# Patient Record
Sex: Male | Born: 1990
Health system: Southern US, Community
[De-identification: ages and names within clinical notes are randomized; demographics above are authoritative.]

## PROBLEM LIST (undated history)

## (undated) DIAGNOSIS — E785 Hyperlipidemia, unspecified: Secondary | ICD-10-CM

## (undated) DIAGNOSIS — J302 Other seasonal allergic rhinitis: Secondary | ICD-10-CM

## (undated) DIAGNOSIS — T7840XA Allergy, unspecified, initial encounter: Secondary | ICD-10-CM

## (undated) HISTORY — DX: Hyperlipidemia, unspecified: E78.5

## (undated) HISTORY — PX: BRAIN SURGERY: SHX531

## (undated) HISTORY — DX: Allergy, unspecified, initial encounter: T78.40XA

## (undated) HISTORY — DX: Other seasonal allergic rhinitis: J30.2

---

## 2011-12-24 ENCOUNTER — Other Ambulatory Visit: Payer: Self-pay | Admitting: Family Medicine

## 2011-12-24 DIAGNOSIS — R1033 Periumbilical pain: Secondary | ICD-10-CM

## 2011-12-25 ENCOUNTER — Ambulatory Visit
Admission: RE | Admit: 2011-12-25 | Discharge: 2011-12-25 | Disposition: A | Discharge status: Home / Self Care | Payer: Self-pay | Source: Ambulatory Visit | Attending: Family Medicine | Admitting: Family Medicine

## 2011-12-25 DIAGNOSIS — R1033 Periumbilical pain: Secondary | ICD-10-CM

## 2013-08-21 IMAGING — US US ABDOMEN COMPLETE
1 series · 14 of 25 positions shown · non-contrast
Comparison: None.

CLINICAL DATA: Intermittent umbilical pain

COMPLETE ABDOMINAL ULTRASOUND

[Series 1: us abdomen complete · 0.23mm/px · 14 of 80 slices shown]
[im 1/80]
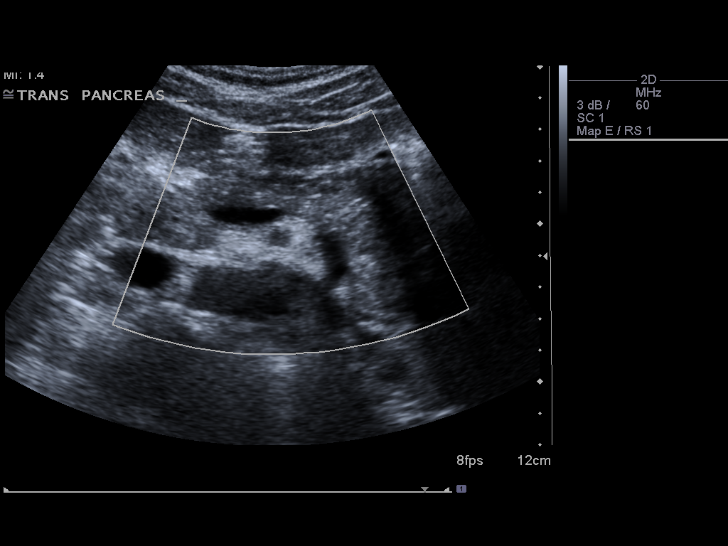
[im 7/80]
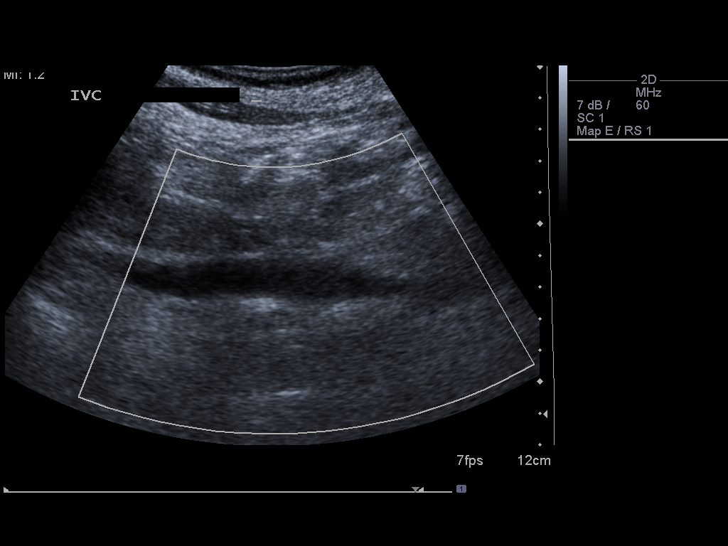
[im 14/80]
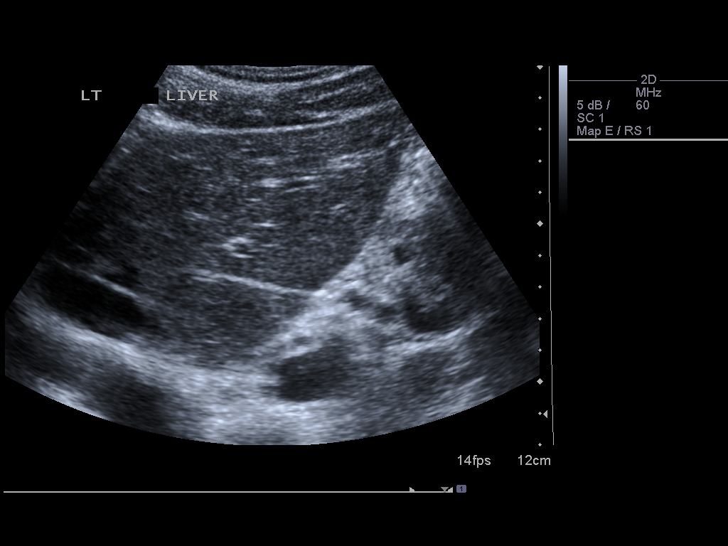
[im 20/80]
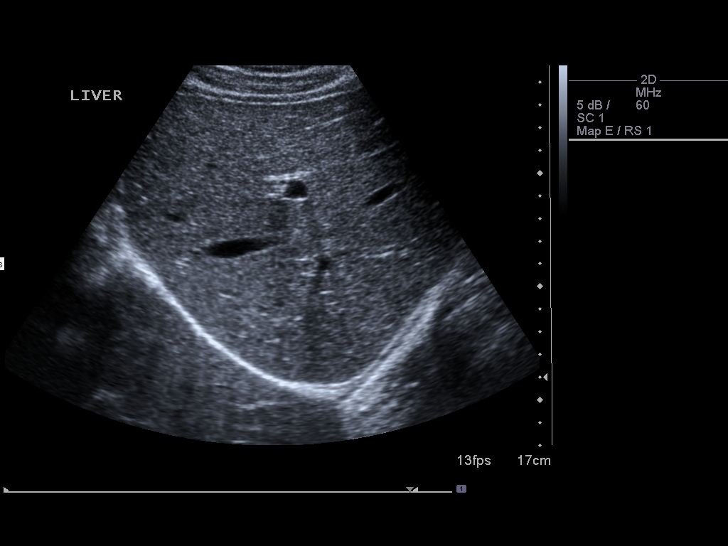
[im 27/80]
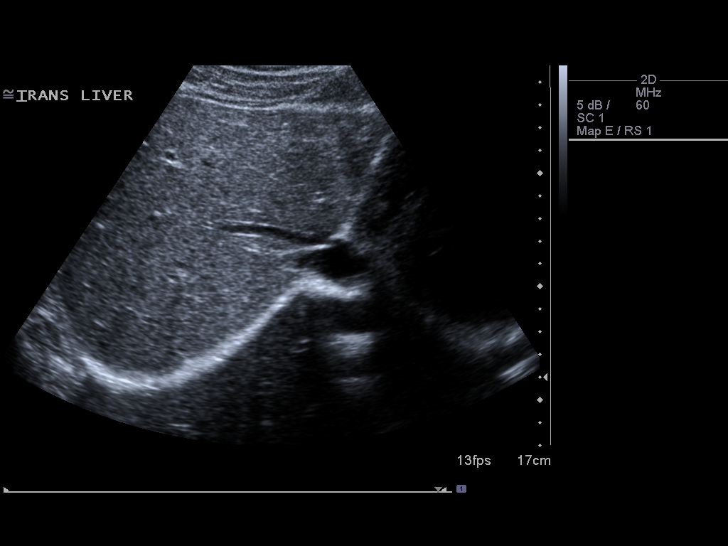
[im 30/80]
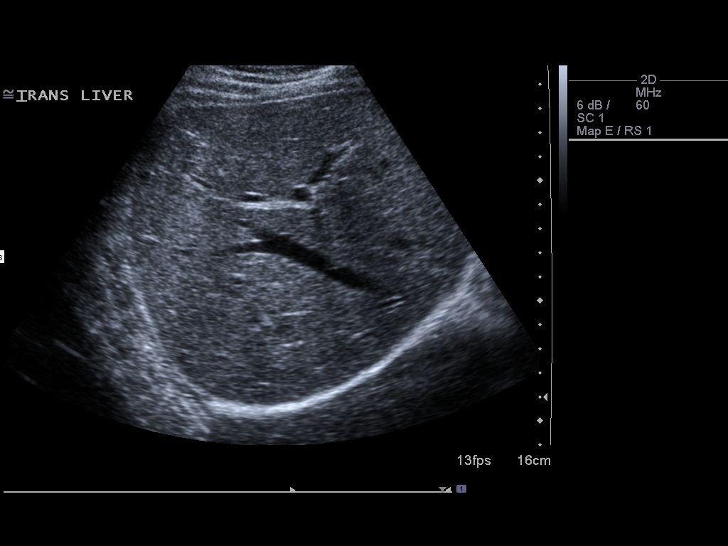
[im 37/80]
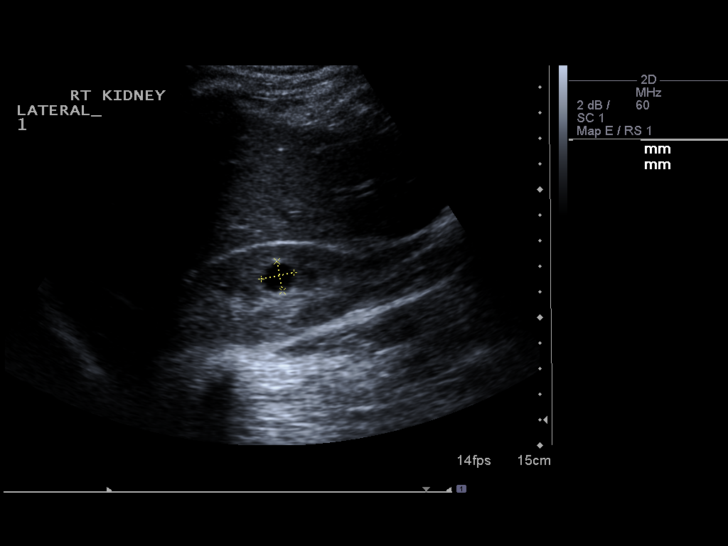
[im 43/80]
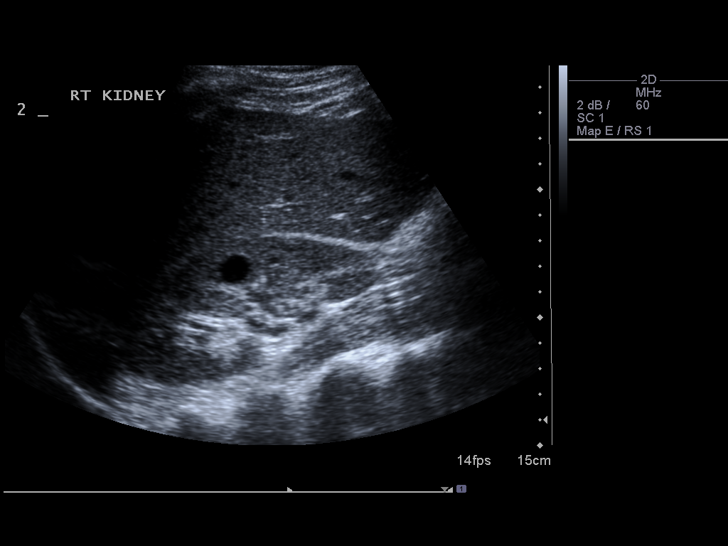
[im 50/80]
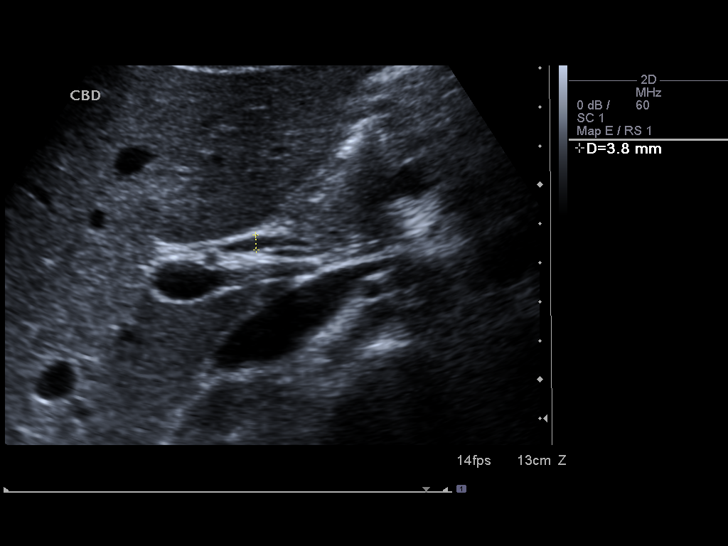
[im 53/80]
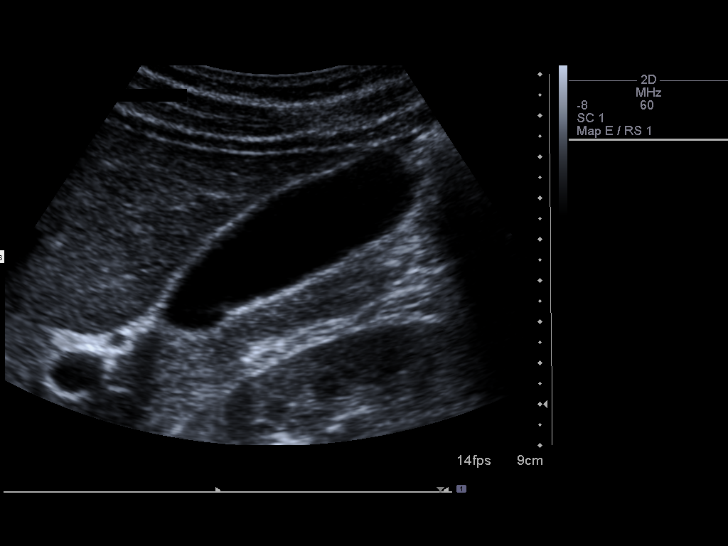
[im 60/80]
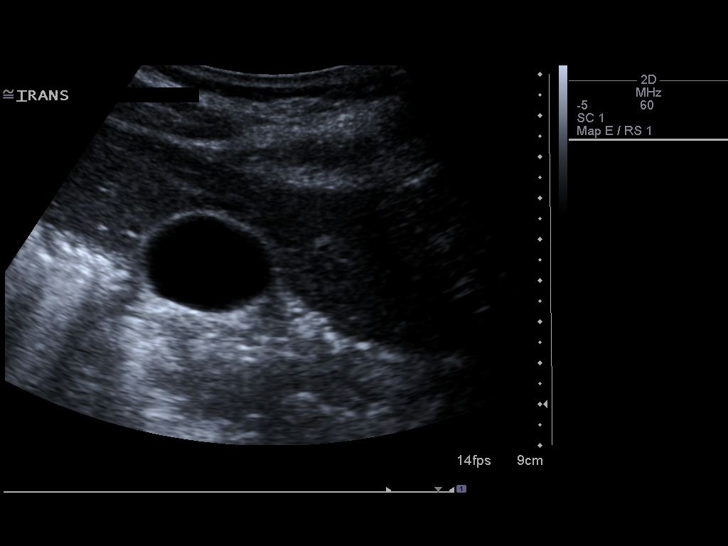
[im 66/80]
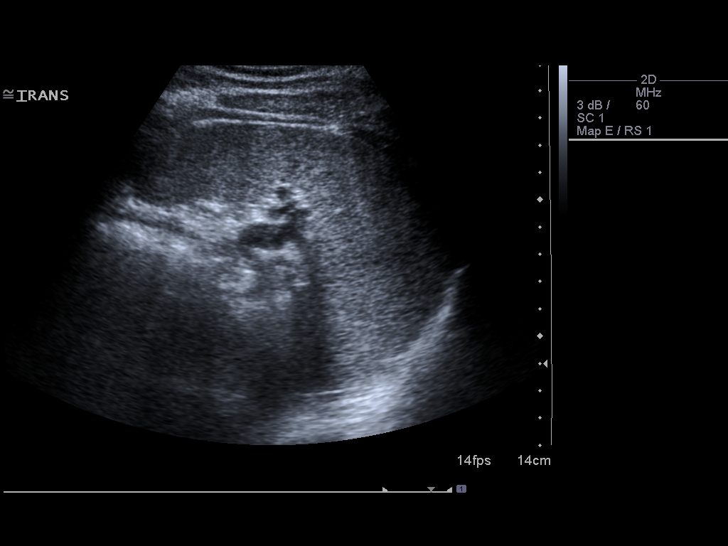
[im 73/80]
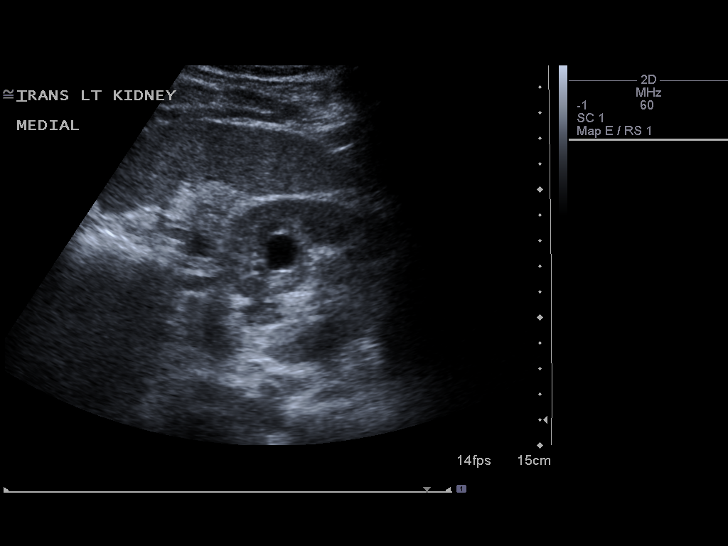
[im 80/80]
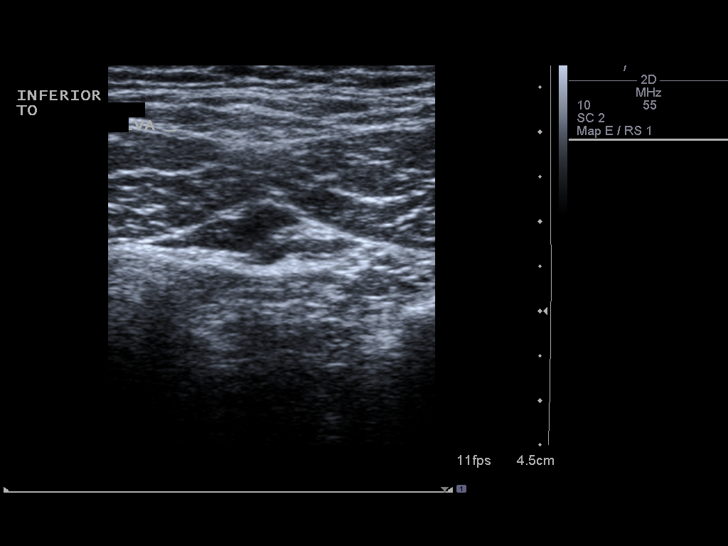

[14 of 25 positions shown; findings below may reference images not displayed]

FINDINGS: Gallbladder:  The gallbladder is visualized and no gallstones are
noted.  There is no pain over the gallbladder with compression.

Common bile duct:  The common bile duct is normal measuring 3.8 mm
in diameter.

Liver:  The liver has a normal echogenic pattern.  No ductal
dilatation is seen.

IVC:  Appears normal.

Pancreas:  No focal abnormality seen.

Spleen:  The pancreas is normal measuring 10.2 cm sagittally.

Right Kidney:  No hydronephrosis is seen.  The right kidney
measures 11.7 cm sagittally.  Several cysts are present measuring
no larger than 1.3 cm in diameter.

Left Kidney:  No hydronephrosis is noted.  The left kidney measures
12.5 cm.  A cyst is noted medially of 1.5 cm in diameter.

Abdominal aorta:  The abdominal aorta is normal in caliber.

Scanning over the umbilicus, no hernia is seen and no mass or fluid
is noted.
IMPRESSION: Negative abdominal ultrasound.  No abnormality is seen around
umbilicus.  No gallstones.  No ductal dilatation.

## 2017-12-04 DIAGNOSIS — H5213 Myopia, bilateral: Secondary | ICD-10-CM | POA: Diagnosis not present

## 2017-12-04 DIAGNOSIS — H52221 Regular astigmatism, right eye: Secondary | ICD-10-CM | POA: Diagnosis not present

## 2018-01-16 ENCOUNTER — Ambulatory Visit (INDEPENDENT_AMBULATORY_CARE_PROVIDER_SITE_OTHER): Payer: 59 | Admitting: Physician Assistant

## 2018-01-16 ENCOUNTER — Other Ambulatory Visit: Payer: Self-pay

## 2018-01-16 ENCOUNTER — Encounter: Payer: Self-pay | Admitting: Physician Assistant

## 2018-01-16 VITALS — BP 132/92 | HR 72 | Temp 98.5°F | Ht 70.0 in | Wt 181.6 lb

## 2018-01-16 DIAGNOSIS — Z1329 Encounter for screening for other suspected endocrine disorder: Secondary | ICD-10-CM | POA: Diagnosis not present

## 2018-01-16 DIAGNOSIS — Z23 Encounter for immunization: Secondary | ICD-10-CM

## 2018-01-16 DIAGNOSIS — E7800 Pure hypercholesterolemia, unspecified: Secondary | ICD-10-CM

## 2018-01-16 DIAGNOSIS — Z130 Encounter for screening for diseases of the blood and blood-forming organs and certain disorders involving the immune mechanism: Secondary | ICD-10-CM

## 2018-01-16 DIAGNOSIS — Z1321 Encounter for screening for nutritional disorder: Secondary | ICD-10-CM | POA: Diagnosis not present

## 2018-01-16 DIAGNOSIS — Z13228 Encounter for screening for other metabolic disorders: Secondary | ICD-10-CM | POA: Diagnosis not present

## 2018-01-16 DIAGNOSIS — Z0000 Encounter for general adult medical examination without abnormal findings: Secondary | ICD-10-CM | POA: Diagnosis not present

## 2018-01-16 NOTE — Patient Instructions (Addendum)
  Keep a log of your BP.  If it runs consistently greater than 140 over 90 then please write these down and come back and see me. Good luck in PA school.    IF you received an x-ray today, you will receive an invoice from [facility]. Please contact [facility] at [phone] with questions or concerns regarding your invoice.   IF you received labwork today, you will receive an invoice from [facility]. Please contact [facility] at [phone] with questions or concerns regarding your invoice.   Our billing staff will not be able to assist you with questions regarding bills from these companies.  You will be contacted with the lab results as soon as they are available. The fastest way to get your results is to activate your My Chart account. Instructions are located on the last page of this paperwork. If you have not heard from [place] regarding the results in 2 weeks, please contact this office.

## 2018-01-16 NOTE — Progress Notes (Signed)
01/16/2018 2:07 PM   DOB: 08-Jan-1991 / MRN: [id number]  SUBJECTIVE:  Jeremy Blanchard is a 27 y.o. male presenting for annual exam.  Does not have a copy of his MMR titers and would like immunization for that today.  Does not drink or smoke.  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 Family Hx negative for early cardiac death but maternal grand father and grand mother both with disease in 19s and 30s.  Would like lipids checked today to further define his risk.    There is no immunization history for the selected administration types on file for this patient.   He has No Known Allergies.   He  has no past medical history on file.    He  reports that he has never smoked. He has never used smokeless tobacco. He reports that he does not use drugs. He  reports that he currently engages in sexual activity. The patient  has no past surgical history on file.  His family history is not on file.  Review of Systems  Constitutional: Negative for chills, diaphoresis and fever.  Eyes: Negative.   Respiratory: Negative for cough, hemoptysis, sputum production, shortness of breath and wheezing.   Cardiovascular: Negative for chest pain, orthopnea and leg swelling.  Gastrointestinal: Negative for abdominal pain, blood in stool, constipation, diarrhea, heartburn, melena, nausea and vomiting.  Genitourinary: Negative for dysuria, flank pain, frequency, hematuria and urgency.  Skin: Negative for rash.  Neurological: Negative for dizziness, sensory change, speech change, focal weakness and headaches.    The problem list and medications were reviewed and updated by myself where necessary and exist elsewhere in the encounter.   OBJECTIVE:  BP (!) 132/92 (BP Location: Left Arm, Patient Position: Sitting, Cuff Size: Normal)   Pulse 72   Temp 98.5 F (36.9 C) (Oral)   Ht '5\' 10"'$  (1.778 m)   Wt 181  lb 9.6 oz (82.4 kg)   SpO2 96%   BMI 26.06 kg/m   Wt Readings from Last 3 Encounters:  01/16/18 181 lb 9.6 oz (82.4 kg)   Temp Readings from Last 3 Encounters:  01/16/18 98.5 F (36.9 C) (Oral)   BP Readings from Last 3 Encounters:  01/16/18 (!) 132/92   Pulse Readings from Last 3 Encounters:  01/16/18 72     Physical Exam  Constitutional: He is oriented to person, place, and time. He appears well-developed. He is active.  Non-toxic appearance. He does not appear ill.  Eyes: Pupils are equal, round, and reactive to light. Conjunctivae and EOM are normal.  Cardiovascular: Normal rate, regular rhythm, S1 normal, S2 normal, normal heart sounds, intact distal pulses and normal pulses. Exam reveals no gallop and no friction rub.  No murmur heard. Pulmonary/Chest: Effort normal. No stridor. No respiratory distress. He has no wheezes. He has no rales.  Abdominal: Soft. Normal appearance and bowel sounds are normal. He exhibits no distension and no mass. There is no tenderness. There is no rigidity, no rebound, no guarding and no CVA tenderness. No hernia.  Musculoskeletal: Normal range of motion. He exhibits no edema.  Neurological: He is alert and oriented to person, place, and time. He has normal strength and normal reflexes. He is not disoriented. No cranial nerve deficit or sensory deficit. He exhibits normal muscle tone. Coordination and gait normal.  Skin: Skin is warm  and dry. He is not diaphoretic. No pallor.  Psychiatric: He has a normal mood and affect. His behavior is normal.  Nursing note and vitals reviewed.   No results found for this or any previous visit (from the past 72 hour(s)).  No results found.  ASSESSMENT AND PLAN:  Jeremy Blanchard was seen today for establish care.  Diagnoses and all orders for this visit:  Annual physical exam  Screening for endocrine, nutritional, metabolic and immunity disorder -     CBC -     Hemoglobin A1c -     Basic metabolic panel -      Hepatic function panel -     GC/Chlamydia Probe Amp -     Urinalysis, dipstick only -     HIV antibody (with reflex) -     Lipid Panel -     Care order/instruction:  Need for MMR vaccine -     MMR vaccine subcutaneous    The patient is advised to call or return to clinic if he does not see an improvement in symptoms, or to seek the care of the closest emergency department if he worsens with the above plan.   Philis Fendt, MHS, PA-C [facility] 01/16/2018 2:07 PM

## 2018-01-17 LAB — URINALYSIS, DIPSTICK ONLY
BILIRUBIN UA: NEGATIVE
Glucose, UA: NEGATIVE
KETONES UA: NEGATIVE
Leukocytes, UA: NEGATIVE
NITRITE UA: NEGATIVE
PH UA: 6 (ref 5.0–7.5)
RBC UA: NEGATIVE
SPEC GRAV UA: 1.024 (ref 1.005–1.030)
Urobilinogen, Ur: 0.2 mg/dL (ref 0.2–1.0)

## 2018-01-17 LAB — HEMOGLOBIN A1C
Est. average glucose Bld gHb Est-mCnc: 108 mg/dL
Hgb A1c MFr Bld: 5.4 % (ref 4.8–5.6)

## 2018-01-17 LAB — HEPATIC FUNCTION PANEL
ALT: 27 IU/L (ref 0–44)
AST: 20 IU/L (ref 0–40)
Albumin: 4.9 g/dL (ref 3.5–5.5)
Alkaline Phosphatase: 49 IU/L (ref 39–117)
Bilirubin Total: 0.4 mg/dL (ref 0.0–1.2)
Bilirubin, Direct: 0.13 mg/dL (ref 0.00–0.40)
Total Protein: 7.4 g/dL (ref 6.0–8.5)

## 2018-01-17 LAB — BASIC METABOLIC PANEL
BUN/Creatinine Ratio: 15 (ref 9–20)
BUN: 14 mg/dL (ref 6–20)
CALCIUM: 9.6 mg/dL (ref 8.7–10.2)
CO2: 25 mmol/L (ref 20–29)
CREATININE: 0.95 mg/dL (ref 0.76–1.27)
Chloride: 99 mmol/L (ref 96–106)
GFR, EST AFRICAN AMERICAN: 127 mL/min/{1.73_m2} (ref >59)
GFR, EST NON AFRICAN AMERICAN: 110 mL/min/{1.73_m2} (ref >59)
Glucose: 103 mg/dL — ABNORMAL HIGH (ref 65–99)
POTASSIUM: 4.6 mmol/L (ref 3.5–5.2)
Sodium: 139 mmol/L (ref 134–144)

## 2018-01-17 LAB — LIPID PANEL
CHOL/HDL RATIO: 4.6 ratio (ref 0.0–5.0)
Cholesterol, Total: 183 mg/dL (ref 100–199)
HDL: 40 mg/dL (ref >39)
LDL CALC: 127 mg/dL — AB (ref 0–99)
TRIGLYCERIDES: 81 mg/dL (ref 0–149)
VLDL Cholesterol Cal: 16 mg/dL (ref 5–40)

## 2018-01-17 LAB — CBC
Hematocrit: 47.3 % (ref 37.5–51.0)
Hemoglobin: 16.3 g/dL (ref 13.0–17.7)
MCH: 29.2 pg (ref 26.6–33.0)
MCHC: 34.5 g/dL (ref 31.5–35.7)
MCV: 85 fL (ref 79–97)
Platelets: 233 x10E3/uL (ref 150–379)
RBC: 5.58 x10E6/uL (ref 4.14–5.80)
RDW: 13.4 % (ref 12.3–15.4)
WBC: 5 x10E3/uL (ref 3.4–10.8)

## 2018-01-17 LAB — HIV ANTIBODY (ROUTINE TESTING W REFLEX): HIV Screen 4th Generation wRfx: NONREACTIVE

## 2018-01-18 LAB — GC/CHLAMYDIA PROBE AMP
CHLAMYDIA, DNA PROBE: NEGATIVE
NEISSERIA GONORRHOEAE BY PCR: NEGATIVE

## 2018-01-19 DIAGNOSIS — E7800 Pure hypercholesterolemia, unspecified: Secondary | ICD-10-CM | POA: Diagnosis not present

## 2018-01-19 NOTE — Addendum Note (Signed)
Addended by: Ileana Roup on: 01/19/2018 10:57 AM   Modules accepted: Orders

## 2018-01-20 LAB — HIGH SENSITIVITY CRP: CRP HIGH SENSITIVITY: 2.36 mg/L (ref 0.00–3.00)

## 2018-01-20 LAB — APOLIPOPROTEIN B: APOLIPOPROTEIN B: 107 mg/dL — AB (ref <90)

## 2018-01-21 NOTE — Progress Notes (Signed)
I discussed the results with the patient and advised high-fiber diet.  He will come back in about 3 months for a recheck of this.  A morning appointment would be best if he needs to be fasting.  Please facilitate a 67-month appointment for the patient.

## 2018-05-11 ENCOUNTER — Encounter: Payer: Self-pay | Admitting: Family Medicine

## 2018-05-11 ENCOUNTER — Ambulatory Visit: Payer: Self-pay | Admitting: Family Medicine

## 2018-05-11 VITALS — BP 120/85 | HR 85 | Temp 98.9°F | Resp 18 | Wt 184.2 lb

## 2018-05-11 DIAGNOSIS — J069 Acute upper respiratory infection, unspecified: Secondary | ICD-10-CM

## 2018-05-11 MED ORDER — DOXYCYCLINE HYCLATE 100 MG PO TABS: 100.0000 mg | ORAL_TABLET | Freq: Two times a day (BID) | ORAL | 0 refills | Status: AC

## 2018-05-11 NOTE — Patient Instructions (Signed)
Upper Respiratory Infection, Adult Most upper respiratory infections (URIs) are caused by a virus. A URI affects the nose, throat, and upper air passages. The most common type of URI is often called "the common cold." Follow these instructions at home:  Take medicines only as told by your doctor.  Gargle warm saltwater or take cough drops to comfort your throat as told by your doctor.  Use a warm mist humidifier or inhale steam from a shower to increase air moisture. This may make it easier to breathe.  Drink enough fluid to keep your pee (urine) clear or pale yellow.  Eat soups and other clear broths.  Have a healthy diet.  Rest as needed.  Go back to work when your fever is gone or your doctor says it is okay. ? You may need to stay home longer to avoid giving your URI to others. ? You can also wear a face mask and wash your hands often to prevent spread of the virus.  Use your inhaler more if you have asthma.  Do not use any tobacco products, including cigarettes, chewing tobacco, or electronic cigarettes. If you need help quitting, ask your doctor. Contact a doctor if:  You are getting worse, not better.  Your symptoms are not helped by medicine.  You have chills.  You are getting more short of breath.  You have brown or red mucus.  You have yellow or brown discharge from your nose.  You have pain in your face, especially when you bend forward.  You have a fever.  You have puffy (swollen) neck glands.  You have pain while swallowing.  You have white areas in the back of your throat. Get help right away if:  You have very bad or constant: ? Headache. ? Ear pain. ? Pain in your forehead, behind your eyes, and over your cheekbones (sinus pain). ? Chest pain.  You have long-lasting (chronic) lung disease and any of the following: ? Wheezing. ? Long-lasting cough. ? Coughing up blood. ? A change in your usual mucus.  You have a stiff neck.  You have  changes in your: ? Vision. ? Hearing. ? Thinking. ? Mood. This information is not intended to replace advice given to you by your health care provider. Make sure you discuss any questions you have with your health care provider. Document Released: 02/19/2008 Document Revised: 05/05/2016 Document Reviewed: 12/08/2013 Elsevier Interactive Patient Education  2018 Elsevier Inc.  

## 2018-05-11 NOTE — Progress Notes (Signed)
Jeremy Blanchard is a 27 y.o. male who presents today with concerns of cough x 4 days. 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 He reports fevers for the past 2 days. TMAX 102 but responds to antipyretic. Patient reports fatigue and productive cough that he has been using Mucinex DM for with minimal relief.   Review of Systems  Constitutional: Negative for chills, fever and malaise/fatigue.  HENT: Positive for congestion. Negative for ear discharge, ear pain, sinus pain and sore throat.   Eyes: Negative.   Respiratory: Positive for cough and sputum production. Negative for shortness of breath.   Cardiovascular: Negative.  Negative for chest pain.  Gastrointestinal: Negative for abdominal pain, diarrhea, nausea and vomiting.  Genitourinary: Negative for dysuria, frequency, hematuria and urgency.  Musculoskeletal: Negative for myalgias.  Skin: Negative.   Neurological: Negative for headaches.  Endo/Heme/Allergies: Negative.   Psychiatric/Behavioral: Negative.     O: Vitals:   05/11/18 1418  BP: 120/85  Pulse: 85  Resp: 18  Temp: 98.9 F (37.2 C)  SpO2: 94%     Physical Exam  Constitutional: He is oriented to person, place, and time. Vital signs are normal. He appears well-developed and well-nourished. He is active.  Non-toxic appearance. He does not have a sickly appearance.  HENT:  Head: Normocephalic.  Right Ear: Hearing, tympanic membrane, external ear and ear canal normal.  Left Ear: Hearing, tympanic membrane, external ear and ear canal normal.  Nose: Nose normal.  Mouth/Throat: Uvula is midline and oropharynx is clear and moist.  Neck: Normal range of motion. Neck supple.  Cardiovascular: Normal rate, regular rhythm, normal heart sounds and normal pulses.  Pulmonary/Chest: Effort normal. He has no decreased breath sounds. He has no wheezes. He has rhonchi in the right middle field, the right lower field, the left middle field and the  left lower field. He has no rales.  Abdominal: Soft. Bowel sounds are normal.  Musculoskeletal: Normal range of motion.  Lymphadenopathy:       Head (right side): No submental and no submandibular adenopathy present.       Head (left side): No submental and no submandibular adenopathy present.    He has no cervical adenopathy.  Neurological: He is alert and oriented to person, place, and time.  Psychiatric: He has a normal mood and affect.  Vitals reviewed.  A: 1. Upper respiratory tract infection, unspecified type    P: Discussed exam findings, diagnosis etiology and medication use and indications reviewed with patient. Follow- Up and discharge instructions provided. No emergent/urgent issues found on exam.  Patient verbalized understanding of information provided and agrees with plan of care (POC), all questions answered.  1. Upper respiratory tract infection, unspecified type - doxycycline (VIBRA-TABS) 100 MG tablet; Take 1 tablet (100 mg total) by mouth 2 (two) times daily for 10 days.  Other orders - dextromethorphan-guaiFENesin (MUCINEX DM) 30-600 MG 12hr tablet; Take 1 tablet by mouth 2 (two) times daily. - acetaminophen (TYLENOL) 160 MG/5ML elixir; Take 15 mg/kg by mouth every 4 (four) hours as needed for fever.

## 2018-05-11 NOTE — Progress Notes (Signed)
Work note, patient can return to work on 05/13/2018

## 2018-05-13 ENCOUNTER — Telehealth: Payer: Self-pay

## 2018-05-13 NOTE — Telephone Encounter (Signed)
Mailbox is full.

## 2018-05-13 NOTE — Telephone Encounter (Signed)
Patient called [place] back and states he is doing better, he is feeling good.

## 2018-05-28 ENCOUNTER — Ambulatory Visit: Payer: Self-pay | Admitting: Nurse Practitioner

## 2018-05-28 VITALS — BP 118/80 | HR 87 | Temp 98.4°F | Wt 181.6 lb

## 2018-05-28 DIAGNOSIS — L259 Unspecified contact dermatitis, unspecified cause: Secondary | ICD-10-CM

## 2018-05-28 DIAGNOSIS — J209 Acute bronchitis, unspecified: Secondary | ICD-10-CM

## 2018-05-28 MED ORDER — AZITHROMYCIN 250 MG PO TABS: ORAL_TABLET | ORAL | 0 refills | Status: DC

## 2018-05-28 MED ORDER — PSEUDOEPH-BROMPHEN-DM 30-2-10 MG/5ML PO SYRP: 5.0000 mL | ORAL_SOLUTION | Freq: Four times a day (QID) | ORAL | 0 refills | Status: AC | PRN

## 2018-05-28 MED ORDER — PREDNISONE 10 MG (21) PO TBPK: ORAL_TABLET | ORAL | 0 refills | Status: DC

## 2018-05-28 NOTE — Progress Notes (Signed)
Subjective:    Patient ID: Jeremy Blanchard, male    DOB: [birth year], 27 y.o.   MRN: [id number]  The patient is a 27 year old male who presents today for complaints of cough and a rash on his chest.  Patient states he was treated for an upper respiratory infection at the end of August in our clinic.  Patient states his symptoms got better, but he still has a lingering cough.  Patient states his concern is that the cough is still productive at this point.  Cough  This is a new problem. The current episode started 1 to 4 weeks ago. The problem has been gradually worsening. The problem occurs every few minutes. The cough is productive of sputum. Associated symptoms include postnasal drip and a rash. Pertinent negatives include no ear congestion, ear pain, nasal congestion or sore throat. Nothing aggravates the symptoms. He has tried OTC cough suppressant (Mucinex) for the symptoms. The treatment provided no relief. His past medical history is significant for environmental allergies.  Rash  This is a new problem. The current episode started yesterday. The problem is unchanged. The affected locations include the chest. The rash is characterized by redness. He was exposed to nothing. Associated symptoms include coughing. Pertinent negatives include no sore throat. (None) Past treatments include nothing. His past medical history is significant for allergies.   I have reviewed the patient's past medical history, current medications, and allergies.   Review of Systems  Constitutional: Negative.   HENT: Positive for postnasal drip. Negative for ear pain, sinus pressure, sinus pain and sore throat.   Eyes: Negative.   Respiratory: Positive for cough.        Productive of yellow sputum  Cardiovascular: Negative.   Gastrointestinal: Negative.   Skin: Positive for rash.  Allergic/Immunologic: Positive for environmental allergies.  Neurological: Negative.       Objective:   Physical Exam   Constitutional: He is oriented to person, place, and time. He appears well-developed and well-nourished. No distress.  HENT:  Head: Normocephalic and atraumatic.  Right Ear: External ear normal.  Left Ear: External ear normal.  Nose: Nose normal.  Mouth/Throat: Oropharynx is clear and moist.  Eyes: Pupils are equal, round, and reactive to light. EOM are normal.  Neck: Normal range of motion. Neck supple. No thyromegaly present.  Cardiovascular: Normal rate, regular rhythm and normal heart sounds.  Pulmonary/Chest: Effort normal and breath sounds normal.  Abdominal: Soft. Bowel sounds are normal.  Neurological: He is alert and oriented to person, place, and time.  Skin: Skin is warm and dry. Capillary refill takes less than 2 seconds.  Psychiatric: He has a normal mood and affect. His behavior is normal.       Assessment & Plan:  Acute bronchitis and contact dermatitis  Exam findings, diagnosis etiology and medication use and indications reviewed with patient. Follow- Up and discharge instructions provided. No emergent/urgent issues found on exam.  Patient verbalized understanding of information provided and agrees with plan of care (POC), all questions answered. The patient is advised to call or return to clinic if he does not see an improvement in symptoms, or to seek the care of the closest emergency department if condition worsens with the above plan.   1. Acute bronchitis, unspecified organism  - predniSONE (STERAPRED UNI-PAK 21 TAB) 10 MG (21) TBPK tablet; Take as directed.  Dispense: 21 tablet; Refill: 0 - azithromycin (ZITHROMAX) 250 MG tablet; Take as directed.  Dispense: 6 tablet; Refill: 0 - brompheniramine-pseudoephedrine-DM 30-2-10  MG/5ML syrup; Take 5 mLs by mouth 4 (four) times daily as needed for up to 7 days.  Dispense: 150 mL; Refill: 0 -Take prednisone as prescribed.  This should help with your cough and the rash. -Take Bromfed and Azithromycin as  prescribed. -Increase fluids. -Sleep elevated on at least 2 pillows at bedtime. -Ibuprofen or Tylenol for pain, fever or general discomfort. -May use a teaspoon of honey as needed for cough. -Use humidifier or vaporizer when at home and during sleep. -Follow up as needed.  2. Contact dermatitis, unspecified contact dermatitis type, unspecified trigger  - predniSONE (STERAPRED UNI-PAK 21 TAB) 10 MG (21) TBPK tablet; Take as directed.  Dispense: 21 tablet; Refill: 0

## 2018-05-28 NOTE — Patient Instructions (Signed)
Acute Bronchitis, Adult -Take prednisone as prescribed.  This should help with your cough and the rash. -Take Bromfed and Azithromycin as prescribed. -Increase fluids. -Sleep elevated on at least 2 pillows at bedtime. -Ibuprofen or Tylenol for pain, fever or general discomfort. -May use a teaspoon of honey as needed for cough. -Use humidifier or vaporizer when at home and during sleep. -Follow up as needed.  Acute bronchitis is sudden (acute) swelling of the air tubes (bronchi) in the lungs. Acute bronchitis causes these tubes to fill with mucus, which can make it hard to breathe. It can also cause coughing or wheezing. In adults, acute bronchitis usually goes away within 2 weeks. A cough caused by bronchitis may last up to 3 weeks. Smoking, allergies, and asthma can make the condition worse. Repeated episodes of bronchitis may cause further lung problems, such as chronic obstructive pulmonary disease (COPD). What are the causes? This condition can be caused by germs and by substances that irritate the lungs, including:  Cold and flu viruses. This condition is most often caused by the same virus that causes a cold.  Bacteria.  Exposure to tobacco smoke, dust, fumes, and air pollution.  What increases the risk? This condition is more likely to develop in people who:  Have close contact with someone with acute bronchitis.  Are exposed to lung irritants, such as tobacco smoke, dust, fumes, and vapors.  Have a weak immune system.  Have a respiratory condition such as asthma.  What are the signs or symptoms? Symptoms of this condition include:  A cough.  Coughing up clear, yellow, or green mucus.  Wheezing.  Chest congestion.  Shortness of breath.  A fever.  Body aches.  Chills.  A sore throat.  How is this diagnosed? This condition is usually diagnosed with a physical exam. During the exam, your health care provider may order tests, such as chest X-rays, to rule out  other conditions. He or she may also:  Test a sample of your mucus for bacterial infection.  Check the level of oxygen in your blood. This is done to check for pneumonia.  Do a chest X-ray or lung function testing to rule out pneumonia and other conditions.  Perform blood tests.  Your health care provider will also ask about your symptoms and medical history. How is this treated? Most cases of acute bronchitis clear up over time without treatment. Your health care provider may recommend:  Drinking more fluids. Drinking more makes your mucus thinner, which may make it easier to breathe.  Taking a medicine for a fever or cough.  Taking an antibiotic medicine.  Using an inhaler to help improve shortness of breath and to control a cough.  Using a cool mist vaporizer or humidifier to make it easier to breathe.  Follow these instructions at home: Medicines  Take over-the-counter and prescription medicines only as told by your health care provider.  If you were prescribed an antibiotic, take it as told by your health care provider. Do not stop taking the antibiotic even if you start to feel better. General instructions  Get plenty of rest.  Drink enough fluids to keep your urine clear or pale yellow.  Avoid smoking and secondhand smoke. Exposure to cigarette smoke or irritating chemicals will make bronchitis worse. If you smoke and you need help quitting, ask your health care provider. Quitting smoking will help your lungs heal faster.  Use an inhaler, cool mist vaporizer, or humidifier as told by your health care provider.  Keep all follow-up visits as told by your health care provider. This is important. How is this prevented? To lower your risk of getting this condition again:  Wash your hands often with soap and water. If soap and water are not available, use hand sanitizer.  Avoid contact with people who have cold symptoms.  Try not to touch your hands to your mouth,  nose, or eyes.  Make sure to get the flu shot every year.  Contact a health care provider if:  Your symptoms do not improve in 2 weeks of treatment. Get help right away if:  You cough up blood.  You have chest pain.  You have severe shortness of breath.  You become dehydrated.  You faint or keep feeling like you are going to faint.  You keep vomiting.  You have a severe headache.  Your fever or chills gets worse. This information is not intended to replace advice given to you by your health care provider. Make sure you discuss any questions you have with your health care provider. Document Released: 10/10/2004 Document Revised: 03/27/2016 Document Reviewed: 02/21/2016 Elsevier Interactive Patient Education  2018 [employer].  Rash A rash is a change in the color of the skin. A rash can also change the way your skin feels. There are many different conditions and factors that can cause a rash. Follow these instructions at home: Pay attention to any changes in your symptoms. Follow these instructions to help with your condition: Medicine Take or apply over-the-counter and prescription medicines only as told by your health care provider. These may include:  Corticosteroid cream.  Anti-itch lotions.  Oral antihistamines.  Skin Care  Apply cool compresses to the affected areas.  Try taking a bath with: ? Epsom salts. Follow the instructions on the packaging. You can get these at your local pharmacy or grocery store. ? Baking soda. Pour a small amount into the bath as told by your health care provider. ? Colloidal oatmeal. Follow the instructions on the packaging. You can get this at your local pharmacy or grocery store.  Try applying baking soda paste to your skin. Stir water into baking soda until it reaches a paste-like consistency.  Do not scratch or rub your skin.  Avoid covering the rash. Make sure the rash is exposed to air as much as possible. General  instructions  Avoid hot showers or baths, which can make itching worse. A cold shower may help.  Avoid scented soaps, detergents, and perfumes. Use gentle soaps, detergents, perfumes, and other cosmetic products.  Avoid any substance that causes your rash. Keep a journal to help track what causes your rash. Write down: ? What you eat. ? What cosmetic products you use. ? What you drink. ? What you wear. This includes jewelry.  Keep all follow-up visits as told by your health care provider. This is important. Contact a health care provider if:  You sweat at night.  You lose weight.  You urinate more than normal.  You feel weak.  You vomit.  Your skin or the whites of your eyes look yellow (jaundice).  Your skin: ? Tingles. ? Is numb.  Your rash: ? Does not go away after several days. ? Gets worse.  You are: ? Unusually thirsty. ? More tired than normal.  You have: ? New symptoms. ? Pain in your abdomen. ? A fever. ? Diarrhea. Get help right away if:  You develop a rash that covers all or most of your body. The  rash may or may not be painful.  You develop blisters that: ? Are on top of the rash. ? Grow larger or grow together. ? Are painful. ? Are inside your nose or mouth.  You develop a rash that: ? Looks like purple pinprick-sized spots all over your body. ? Has a "bull's eye" or looks like a target. ? Is not related to sun exposure, is red and painful, and causes your skin to peel. This information is not intended to replace advice given to you by your health care provider. Make sure you discuss any questions you have with your health care provider. Document Released: 08/23/2002 Document Revised: 02/06/2016 Document Reviewed: 01/18/2015 Elsevier Interactive Patient Education  2018 [employer].

## 2018-07-17 ENCOUNTER — Encounter: Payer: Self-pay | Admitting: Emergency Medicine

## 2018-07-17 ENCOUNTER — Other Ambulatory Visit: Payer: Self-pay

## 2018-07-17 ENCOUNTER — Ambulatory Visit (INDEPENDENT_AMBULATORY_CARE_PROVIDER_SITE_OTHER): Payer: 59 | Admitting: Emergency Medicine

## 2018-07-17 VITALS — BP 117/69 | HR 67 | Temp 98.7°F | Resp 16 | Ht 69.0 in | Wt 182.4 lb

## 2018-07-17 DIAGNOSIS — T7840XA Allergy, unspecified, initial encounter: Secondary | ICD-10-CM | POA: Insufficient documentation

## 2018-07-17 NOTE — Patient Instructions (Addendum)
     If you have lab work done today you will be contacted with your lab results within the next 2 weeks.  If you have not heard from [place] then please contact us. The fastest way to get your results is to register for My Chart.   IF you received an x-ray today, you will receive an invoice from [facility]. Please contact [facility] at [phone] with questions or concerns regarding your invoice.   IF you received labwork today, you will receive an invoice from [facility] [place]. Please contact [facility] at [phone] with questions or concerns regarding your invoice.   Our billing staff will not be able to assist you with questions regarding bills from these companies.  You will be contacted with the lab results as soon as they are available. The fastest way to get your results is to activate your My Chart account. Instructions are located on the last page of this paperwork. If you have not heard from [place] regarding the results in 2 weeks, please contact this office.     Allergies An allergy is when your body reacts to a substance in a way that is not normal. An allergic reaction can happen after you:  Eat something.  Breathe in something.  Touch something.  You can be allergic to:  Things that are only around during certain seasons, like molds and pollens.  Foods.  Drugs.  Insects.  Animal dander.  What are the signs or symptoms?  Puffiness (swelling). This may happen on the lips, face, tongue, mouth, or throat.  Sneezing.  Coughing.  Breathing loudly (wheezing).  Stuffy nose.  Tingling in the mouth.  A rash.  Itching.  Itchy, red, puffy areas of skin (hives).  Watery eyes.  Throwing up (vomiting).  Watery poop (diarrhea).  Dizziness.  Feeling faint or fainting.  Trouble breathing or swallowing.  A tight feeling in the chest.  A fast heartbeat. How is this diagnosed? Allergies can be diagnosed with:  A medical and family  history.  Skin tests.  Blood tests.  A food diary. A food diary is a record of all the foods, drinks, and symptoms you have each day.  The results of an elimination diet. This diet involves making sure not to eat certain foods and then seeing what happens when you start eating them again.  How is this treated? There is no cure for allergies, but allergic reactions can be treated with medicine. Severe reactions usually need to be treated at a hospital. How is this prevented? The best way to prevent an allergic reaction is to avoid the thing you are allergic to. Allergy shots and medicines can also help prevent reactions in some cases. This information is not intended to replace advice given to you by your health care provider. Make sure you discuss any questions you have with your health care provider. Document Released: 12/28/2012 Document Revised: 04/29/2016 Document Reviewed: 06/14/2014 Elsevier Interactive Patient Education  [employer].

## 2018-07-17 NOTE — Progress Notes (Signed)
Jeremy Blanchard Web 27 y.o.   Chief Complaint  Patient presents with  . Referral    ALLERGIST    HISTORY OF PRESENT ILLNESS: This is a 27 y.o. male complaining dry cough for 2 years.  Coughing with scratchy eyes.  Thinks he is allergic to something at home.  Symptoms improve when he leaves the house.  No other significant symptoms.  Asking for referral to allergist. Non-smoker.  No chronic medical problems.  On no medications at present time.  Zyrtec not helping much.  Sometimes takes Benadryl at nighttime and wakes up feeling better.  Has pets at home.  No one else sick at home.  HPI   Prior to Admission medications   Medication Sig Start Date End Date Taking? Authorizing Provider  acetaminophen (TYLENOL) 160 MG/5ML elixir Take 15 mg/kg by mouth every 4 (four) hours as needed for fever.   Yes [provider]  cetirizine (ZYRTEC) 10 MG tablet Take 10 mg by mouth daily.   Yes [provider]    No Known Allergies  There are no active problems to display for this patient.   No past medical history on file.  No past surgical history on file.  Social History   Socioeconomic History  . Marital status: Single    Spouse name: Not on file  . Number of children: Not on file  . Years of education: Not on file  . Highest education level: Not on file  Occupational History  . Not on file  Social Needs  . Financial resource strain: Not on file  . Food insecurity:    Worry: Not on file    Inability: Not on file  . Transportation needs:    Medical: Not on file    Non-medical: Not on file  Tobacco Use  . Smoking status: Never Smoker  . Smokeless tobacco: Never Used  Substance and Sexual Activity  . Alcohol use: Not on file    Comment: socal drinker  . Drug use: Never  . Sexual activity: Yes  Lifestyle  . Physical activity:    Days per week: Not on file    Minutes per session: Not on file  . Stress: Not on file  Relationships  . Social connections:    Talks  on phone: Not on file    Gets together: Not on file    Attends religious service: Not on file    Active member of club or organization: Not on file    Attends meetings of clubs or organizations: Not on file    Relationship status: Not on file  . Intimate partner violence:    Fear of current or ex partner: Not on file    Emotionally abused: Not on file    Physically abused: Not on file    Forced sexual activity: Not on file  Other Topics Concern  . Not on file  Social History Narrative  . Not on file    No family history on file.   Review of Systems  Constitutional: Negative.  Negative for chills, fever, malaise/fatigue and weight loss.  HENT: Negative.  Negative for congestion, ear pain, nosebleeds, sinus pain and sore throat.   Eyes: Positive for redness.  Respiratory: Positive for cough.   Gastrointestinal: Negative.  Negative for abdominal pain, diarrhea, nausea and vomiting.  Genitourinary: Negative.   Musculoskeletal: Negative.  Negative for back pain, myalgias and neck pain.  Skin: Negative.  Negative for rash.  Neurological: Negative.  Negative for dizziness and headaches.  Endo/Heme/Allergies: Positive for environmental allergies.    Vitals:   07/17/18 1415  BP: 117/69  Pulse: 67  Resp: 16  Temp: 98.7 F (37.1 C)  SpO2: 97%    Physical Exam  Constitutional: He is oriented to person, place, and time. He appears well-developed and well-nourished.  HENT:  Head: Normocephalic and atraumatic.  Right Ear: External ear normal.  Left Ear: External ear normal.  Nose: Nose normal.  Mouth/Throat: Oropharynx is clear and moist.  Eyes: Pupils are equal, round, and reactive to light. Conjunctivae and EOM are normal.  Neck: Normal range of motion. Neck supple. No thyromegaly present.  Cardiovascular: Normal rate, regular rhythm and normal heart sounds.  Pulmonary/Chest: Effort normal and breath sounds normal.  Abdominal: Soft. There is no tenderness.  Musculoskeletal:  Normal range of motion. He exhibits no edema or tenderness.  Lymphadenopathy:    He has no cervical adenopathy.  Neurological: He is alert and oriented to person, place, and time. No sensory deficit. He exhibits normal muscle tone.  Skin: Skin is warm and dry. Capillary refill takes less than 2 seconds. No rash noted.  Psychiatric: He has a normal mood and affect. His behavior is normal.  Vitals reviewed.  A total of 25 minutes was spent in the room with the patient, greater than 50% of which was in counseling/coordination of care regarding differential diagnosis, treatment, medications, and need for follow-up with an allergist.   ASSESSMENT & PLAN:  Dickson was seen today for referral.  Diagnoses and all orders for this visit:  Allergic symptoms, initial encounter -     Ambulatory referral to Allergy    Patient Instructions       If you have lab work done today you will be contacted with your lab results within the next 2 weeks.  If you have not heard from [place] then please contact us. The fastest way to get your results is to register for My Chart.   IF you received an x-ray today, you will receive an invoice from [facility]. Please contact [facility] at [phone] with questions or concerns regarding your invoice.   IF you received labwork today, you will receive an invoice from [place]. Please contact [facility] at [phone] with questions or concerns regarding your invoice.   Our billing staff will not be able to assist you with questions regarding bills from these companies.  You will be contacted with the lab results as soon as they are available. The fastest way to get your results is to activate your My Chart account. Instructions are located on the last page of this paperwork. If you have not heard from [place] regarding the results in 2 weeks, please contact this office.     Allergies An allergy is when your body reacts to a substance in a way that  is not normal. An allergic reaction can happen after you:  Eat something.  Breathe in something.  Touch something.  You can be allergic to:  Things that are only around during certain seasons, like molds and pollens.  Foods.  Drugs.  Insects.  Animal dander.  What are the signs or symptoms?  Puffiness (swelling). This may happen on the lips, face, tongue, mouth, or throat.  Sneezing.  Coughing.  Breathing loudly (wheezing).  Stuffy nose.  Tingling in the mouth.  A rash.  Itching.  Itchy, red, puffy areas of skin (hives).  Watery eyes.  Throwing up (vomiting).  Watery poop (diarrhea).  Dizziness.  Feeling faint or fainting.  Trouble breathing or swallowing.  A tight feeling in the chest.  A fast heartbeat. How is this diagnosed? Allergies can be diagnosed with:  A medical and family history.  Skin tests.  Blood tests.  A food diary. A food diary is a record of all the foods, drinks, and symptoms you have each day.  The results of an elimination diet. This diet involves making sure not to eat certain foods and then seeing what happens when you start eating them again.  How is this treated? There is no cure for allergies, but allergic reactions can be treated with medicine. Severe reactions usually need to be treated at a hospital. How is this prevented? The best way to prevent an allergic reaction is to avoid the thing you are allergic to. Allergy shots and medicines can also help prevent reactions in some cases. This information is not intended to replace advice given to you by your health care provider. Make sure you discuss any questions you have with your health care provider. Document Released: 12/28/2012 Document Revised: 04/29/2016 Document Reviewed: 06/14/2014 Elsevier Interactive Patient Education  2018 [employer].     Agustina Caroli, MD [facility]

## 2018-08-24 ENCOUNTER — Ambulatory Visit: Payer: 59 | Admitting: Allergy and Immunology

## 2018-10-27 DIAGNOSIS — H5213 Myopia, bilateral: Secondary | ICD-10-CM | POA: Diagnosis not present

## 2019-01-28 ENCOUNTER — Encounter: Payer: Self-pay | Admitting: Emergency Medicine

## 2019-01-29 ENCOUNTER — Other Ambulatory Visit: Payer: Self-pay | Admitting: *Deleted

## 2019-02-01 ENCOUNTER — Other Ambulatory Visit: Payer: Self-pay

## 2019-02-01 ENCOUNTER — Ambulatory Visit (INDEPENDENT_AMBULATORY_CARE_PROVIDER_SITE_OTHER): Payer: 59 | Admitting: Emergency Medicine

## 2019-02-01 ENCOUNTER — Encounter: Payer: Self-pay | Admitting: Emergency Medicine

## 2019-02-01 VITALS — BP 127/78 | HR 78 | Temp 99.0°F | Resp 16 | Ht 70.0 in | Wt 192.8 lb

## 2019-02-01 DIAGNOSIS — Z0000 Encounter for general adult medical examination without abnormal findings: Secondary | ICD-10-CM

## 2019-02-01 DIAGNOSIS — Z789 Other specified health status: Secondary | ICD-10-CM | POA: Diagnosis not present

## 2019-02-01 DIAGNOSIS — Z111 Encounter for screening for respiratory tuberculosis: Secondary | ICD-10-CM

## 2019-02-01 NOTE — Addendum Note (Signed)
Addended by: Davina Poke on: 02/01/2019 04:02 PM   Modules accepted: Orders

## 2019-02-01 NOTE — Progress Notes (Signed)
Jeremy Blanchard 28 y.o.   Chief Complaint  Patient presents with  . 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 male here for annual exam for nursing program at [facility]. Healthy man without any chronic medical problems. 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 No complaints or medical concerns today.  HPI   Prior to Admission medications   Medication Sig Start Date End Date Taking? Authorizing Provider  cetirizine (ZYRTEC) 10 MG tablet Take 10 mg by mouth daily.   Yes [provider]  fluticasone (FLONASE) 50 MCG/ACT nasal spray Place into both nostrils as needed for allergies or rhinitis.   Yes [provider]  naproxen sodium (ALEVE) 220 MG tablet Take 220 mg by mouth as needed.   Yes [provider]  acetaminophen (TYLENOL) 160 MG/5ML elixir Take 15 mg/kg by mouth every 4 (four) hours as needed for fever.    [provider]    No Known Allergies  Patient Active Problem List   Diagnosis Date Noted  . Allergic symptoms 07/17/2018    History reviewed. No pertinent past medical history.  History reviewed. No pertinent surgical history.  Social History   Socioeconomic History  . Marital status: Single    Spouse name: Not on file  . Number of children: Not on file  . Years of education: Not on file  . Highest education level: Not on file  Occupational History  . Not on file  Social Needs  . Financial resource strain: Not on file  . Food insecurity:    Worry: Not on file    Inability: Not on file  . Transportation needs:    Medical: Not on file    Non-medical: Not on file  Tobacco Use  . Smoking status: Never Smoker  . Smokeless tobacco: Never Used  Substance and Sexual Activity  . Alcohol use: Not on file    Comment: socal drinker  . Drug use: Never  . Sexual activity: Yes  Lifestyle  .  Physical activity:    Days per week: Not on file    Minutes per session: Not on file  . Stress: Not on file  Relationships  . Social connections:    Talks on phone: Not on file    Gets together: Not on file    Attends religious service: Not on file    Active member of club or organization: Not on file    Attends meetings of clubs or organizations: Not on file    Relationship status: Not on file  . Intimate partner violence:    Fear of current or ex partner: Not on file    Emotionally abused: Not on file    Physically abused: Not on file    Forced sexual activity: Not on file  Other Topics Concern  . Not on file  Social History Narrative  . Not on file    History reviewed. No pertinent family history.   Review of Systems  Constitutional: Negative.  Negative for chills and fever.  HENT: Negative.  Negative for congestion, hearing loss, nosebleeds and sore throat.   Eyes: Negative.  Negative for blurred vision and double vision.  Respiratory: Negative.  Negative for cough, shortness of breath and wheezing.   Cardiovascular: Negative.  Negative for chest pain and palpitations.  Gastrointestinal: Negative.  Negative for abdominal  pain, diarrhea, nausea and vomiting.  Genitourinary: Negative.  Negative for dysuria.  Musculoskeletal: Negative.  Negative for myalgias.  Skin: Negative.   Neurological: Negative.  Negative for dizziness and headaches.  Endo/Heme/Allergies: Negative.   All other systems reviewed and are negative.  Vitals:   02/01/19 1442  BP: 127/78  Pulse: 78  Resp: 16  Temp: 99 F (37.2 C)  SpO2: 97%     Physical Exam Vitals signs reviewed.  Constitutional:      Appearance: Normal appearance. He is normal weight.  HENT:     Head: Normocephalic and atraumatic.     Nose: Nose normal.     Mouth/Throat:     Mouth: Mucous membranes are moist.     Pharynx: Oropharynx is clear.  Eyes:     Extraocular Movements: Extraocular movements intact.      Conjunctiva/sclera: Conjunctivae normal.     Pupils: Pupils are equal, round, and reactive to light.  Neck:     Musculoskeletal: Normal range of motion and neck supple.     Vascular: No carotid bruit.  Cardiovascular:     Rate and Rhythm: Normal rate and regular rhythm.     Pulses: Normal pulses.     Heart sounds: Normal heart sounds.  Pulmonary:     Effort: Pulmonary effort is normal.     Breath sounds: Normal breath sounds.  Abdominal:     General: Abdomen is flat. Bowel sounds are normal. There is no distension.     Palpations: Abdomen is soft.     Tenderness: There is no abdominal tenderness.  Musculoskeletal: Normal range of motion.  Lymphadenopathy:     Cervical: No cervical adenopathy.  Skin:    General: Skin is warm and dry.     Capillary Refill: Capillary refill takes less than 2 seconds.  Neurological:     General: No focal deficit present.     Mental Status: He is alert and oriented to person, place, and time.     Sensory: No sensory deficit.     Motor: No weakness.     Coordination: Coordination normal.  Psychiatric:        Mood and Affect: Mood normal.        Behavior: Behavior normal.      ASSESSMENT & PLAN: Jeremy Blanchard was seen today for annual exam.  Diagnoses and all orders for this visit:  Routine general medical examination at a health care facility  Measles, mumps, rubella (MMR) vaccination status unknown -     Measles/Mumps/Rubella Immunity  Varicella vaccination status unknown -     Varicella zoster antibody, IgG  Hepatitis B virus serologic status unknown -     Hepatitis B surface antibody,quantitative  Screening-pulmonary TB -     QuantiFERON-TB Gold Plus    Patient Instructions       If you have lab work done today you will be contacted with your lab results within the next 2 weeks.  If you have not heard from [place] then please contact us. The fastest way to get your results is to register for My Chart.   IF you received an x-ray  today, you will receive an invoice from [facility]. Please contact [facility] at [phone] with questions or concerns regarding your invoice.   IF you received labwork today, you will receive an invoice from [facility]. Please contact [facility] at [phone] with questions or concerns regarding your invoice.   Our billing staff will not be able to assist you with questions regarding bills  from these companies.  You will be contacted with the lab results as soon as they are available. The fastest way to get your results is to activate your My Chart account. Instructions are located on the last page of this paperwork. If you have not heard from [place] regarding the results in 2 weeks, please contact this office.      Health Maintenance, Male A healthy lifestyle and preventive care is important for your health and wellness. Ask your health care provider about what schedule of regular examinations is right for you. What should I know about weight and diet? Eat a Healthy Diet  Eat plenty of vegetables, fruits, whole grains, low-fat dairy products, and lean protein.  Do not eat a lot of foods high in solid fats, added sugars, or salt.  Maintain a Healthy Weight Regular exercise can help you achieve or maintain a healthy weight. You should:  Do at least 150 minutes of exercise each week. The exercise should increase your heart rate and make you sweat (moderate-intensity exercise).  Do strength-training exercises at least twice a week. Watch Your Levels of Cholesterol and Blood Lipids  Have your blood tested for lipids and cholesterol every 5 years starting at 28 years of age. If you are at high risk for heart disease, you should start having your blood tested when you are 28 years old. You may need to have your cholesterol levels checked more often if: ? Your lipid or cholesterol levels are high. ? You are older than 28 years of age. ? You are at high risk for heart disease.  What should I know about cancer screening? Many types of cancers can be detected early and may often be prevented. Lung Cancer  You should be screened every year for lung cancer if: ? You are a current smoker who has smoked for at least 30 years. ? You are a former smoker who has quit within the past 15 years.  Talk to your health care provider about your screening options, when you should start screening, and how often you should be screened. Colorectal Cancer  Routine colorectal cancer screening usually begins at 28 years of age and should be repeated every 5-10 years until you are 28 years old. You may need to be screened more often if early forms of precancerous polyps or small growths are found. Your health care provider may recommend screening at an earlier age if you have risk factors for colon cancer.  Your health care provider may recommend using home test kits to check for hidden blood in the stool.  A small camera at the end of a tube can be used to examine your colon (sigmoidoscopy or colonoscopy). This checks for the earliest forms of colorectal cancer. Prostate and Testicular Cancer  Depending on your age and overall health, your health care provider may do certain tests to screen for prostate and testicular cancer.  Talk to your health care provider about any symptoms or concerns you have about testicular or prostate cancer. Skin Cancer  Check your skin from head to toe regularly.  Tell your health care provider about any new moles or changes in moles, especially if: ? There is a change in a mole's size, shape, or color. ? You have a mole that is larger than a pencil eraser.  Always use sunscreen. Apply sunscreen liberally and repeat throughout the day.  Protect yourself by wearing long sleeves, pants, a wide-brimmed hat, and sunglasses when outside. What should I know about  heart disease, diabetes, and high blood pressure?  If you are 28-41 years of age, have your  blood pressure checked every 3-5 years. If you are 49 years of age or older, have your blood pressure checked every year. You should have your blood pressure measured twice-once when you are at a hospital or clinic, and once when you are not at a hospital or clinic. Record the average of the two measurements. To check your blood pressure when you are not at a hospital or clinic, you can use: ? An automated blood pressure machine at a pharmacy. ? A home blood pressure monitor.  Talk to your health care provider about your target blood pressure.  If you are between 10-74 years old, ask your health care provider if you should take aspirin to prevent heart disease.  Have regular diabetes screenings by checking your fasting blood sugar level. ? If you are at a normal weight and have a low risk for diabetes, have this test once every three years after the age of 59. ? If you are overweight and have a high risk for diabetes, consider being tested at a younger age or more often.  A one-time screening for abdominal aortic aneurysm (AAA) by ultrasound is recommended for men aged 4-75 years who are current or former smokers. What should I know about preventing infection? Hepatitis B If you have a higher risk for hepatitis B, you should be screened for this virus. Talk with your health care provider to find out if you are at risk for hepatitis B infection. Hepatitis C Blood testing is recommended for:  Everyone born from [birth year] through 1965.  Anyone with known risk factors for hepatitis C. Sexually Transmitted Diseases (STDs)  You should be screened each year for STDs including gonorrhea and chlamydia if: ? You are sexually active and are younger than 28 years of age. ? You are older than 28 years of age and your health care provider tells you that you are at risk for this type of infection. ? Your sexual activity has changed since you were last screened and you are at an increased risk for chlamydia  or gonorrhea. Ask your health care provider if you are at risk.  Talk with your health care provider about whether you are at high risk of being infected with HIV. Your health care provider may recommend a prescription medicine to help prevent HIV infection. What else can I do?  Schedule regular health, dental, and eye exams.  Stay current with your vaccines (immunizations).  Do not use any tobacco products, such as cigarettes, chewing tobacco, and e-cigarettes. If you need help quitting, ask your health care provider.  Limit alcohol intake to no more than 2 drinks per day. One drink equals 12 ounces of beer, 5 ounces of wine, or 1 ounces of hard liquor.  Do not use street drugs.  Do not share needles.  Ask your health care provider for help if you need support or information about quitting drugs.  Tell your health care provider if you often feel depressed.  Tell your health care provider if you have ever been abused or do not feel safe at home. This information is not intended to replace advice given to you by your health care provider. Make sure you discuss any questions you have with your health care provider. Document Released: 02/29/2008 Document Revised: 05/01/2016 Document Reviewed: 06/06/2015 Elsevier Interactive Patient Education  2019 Elsevier Inc.      Agustina Caroli, MD [facility]

## 2019-02-01 NOTE — Patient Instructions (Addendum)

## 2019-02-02 ENCOUNTER — Encounter: Payer: Self-pay | Admitting: Emergency Medicine

## 2019-02-02 ENCOUNTER — Encounter: Payer: Self-pay | Admitting: *Deleted

## 2019-02-02 LAB — MEASLES/MUMPS/RUBELLA IMMUNITY
MUMPS ABS, IGG: 104 AU/mL (ref >10.9)
RUBEOLA AB, IGG: >300 AU/mL (ref >16.4)
Rubella Antibodies, IGG: 3.32 index (ref >0.99)

## 2019-02-02 LAB — VARICELLA ZOSTER ANTIBODY, IGG: Varicella zoster IgG: >4000 index (ref >165)

## 2019-02-02 LAB — HEPATITIS B SURFACE ANTIBODY, QUANTITATIVE: Hepatitis B Surf Ab Quant: 537.5 m[IU]/mL (ref >9.9)

## 2019-02-03 LAB — QUANTIFERON-TB GOLD PLUS
QuantiFERON Mitogen Value: >10 IU/mL
QuantiFERON Nil Value: 0.01 IU/mL
QuantiFERON TB1 Ag Value: 0.01 IU/mL
QuantiFERON TB2 Ag Value: 0.01 IU/mL
QuantiFERON-TB Gold Plus: NEGATIVE

## 2019-02-09 DIAGNOSIS — Z23 Encounter for immunization: Secondary | ICD-10-CM | POA: Diagnosis not present

## 2020-01-12 ENCOUNTER — Other Ambulatory Visit: Payer: Self-pay | Admitting: Emergency Medicine

## 2020-01-14 LAB — QUANTIFERON-TB GOLD PLUS
QuantiFERON Mitogen Value: >10 IU/mL
QuantiFERON Nil Value: 0.07 IU/mL
QuantiFERON TB1 Ag Value: 0.08 IU/mL
QuantiFERON TB2 Ag Value: 0.1 IU/mL
QuantiFERON-TB Gold Plus: NEGATIVE

## 2020-01-25 ENCOUNTER — Telehealth: Payer: Self-pay | Admitting: *Deleted

## 2020-01-25 NOTE — Telephone Encounter (Signed)
Faxed completed form to [facility] with  ICD-10 code for QuantiFERON-TB Gold Plus for billing. The ICD-10 code Screening Pulmonary TB Z11.1. confirmation page 4:50 pm.

## 2020-02-07 ENCOUNTER — Telehealth: Payer: Self-pay | Admitting: *Deleted

## 2020-02-07 NOTE — Telephone Encounter (Signed)
Faxed the ICD-10 code for Screening Pulmonary TB Z11.1 request needed for QuantiFERON-TB Gold Plus, Attn: Cristela Blue. This is the second fax for the same request, the first was 01/25/2020 Attn: Joelene Millin P. Confirmation page today at 8:34 a.m.

## 2020-03-28 ENCOUNTER — Telehealth: Payer: Self-pay | Admitting: Emergency Medicine

## 2020-03-28 DIAGNOSIS — Z0000 Encounter for general adult medical examination without abnormal findings: Secondary | ICD-10-CM

## 2020-03-28 NOTE — Telephone Encounter (Signed)
Please order fasting labs for CPE scheduled for 05/23/2020

## 2020-03-29 NOTE — Telephone Encounter (Signed)
Labs signed for future Annual visit.   Please advise. Do you want to add some more or are these labs good?

## 2020-03-29 NOTE — Telephone Encounter (Signed)
[facility] with these. Add CBC. Thanks.

## 2020-03-29 NOTE — Addendum Note (Signed)
Addended by: Kittie Plater, Alicja Everitt HUA on: 03/29/2020 09:01 AM   Modules accepted: Orders

## 2020-03-29 NOTE — Telephone Encounter (Signed)
NOTED. CBC blood work has been added.   Thanks, [employer]

## 2020-04-20 ENCOUNTER — Ambulatory Visit: Payer: 59

## 2020-05-10 ENCOUNTER — Telehealth: Payer: Self-pay | Admitting: Emergency Medicine

## 2020-05-10 ENCOUNTER — Other Ambulatory Visit: Payer: Self-pay | Admitting: Emergency Medicine

## 2020-05-10 NOTE — Telephone Encounter (Signed)
Future order has already been placed

## 2020-05-10 NOTE — Telephone Encounter (Signed)
Pt is getting his cpe on 07/10/20 and his labs for this appointment is scheduled for 07/06/20. Please put lab orders in.

## 2020-05-18 ENCOUNTER — Ambulatory Visit: Payer: 59

## 2020-05-19 ENCOUNTER — Ambulatory Visit: Payer: 59

## 2020-05-23 ENCOUNTER — Encounter: Payer: 59 | Admitting: Emergency Medicine

## 2020-07-06 ENCOUNTER — Ambulatory Visit: Payer: 59

## 2020-07-06 ENCOUNTER — Other Ambulatory Visit: Payer: Self-pay

## 2020-07-06 DIAGNOSIS — Z0000 Encounter for general adult medical examination without abnormal findings: Secondary | ICD-10-CM

## 2020-07-07 LAB — CBC WITH DIFFERENTIAL/PLATELET
Basophils Absolute: 0 10*3/uL (ref 0.0–0.2)
Basos: 1 %
EOS (ABSOLUTE): 0.1 10*3/uL (ref 0.0–0.4)
Eos: 1 %
Hematocrit: 45.6 % (ref 37.5–51.0)
Hemoglobin: 15.7 g/dL (ref 13.0–17.7)
Immature Grans (Abs): 0 10*3/uL (ref 0.0–0.1)
Immature Granulocytes: 0 %
Lymphocytes Absolute: 2.3 10*3/uL (ref 0.7–3.1)
Lymphs: 41 %
MCH: 29 pg (ref 26.6–33.0)
MCHC: 34.4 g/dL (ref 31.5–35.7)
MCV: 84 fL (ref 79–97)
Monocytes Absolute: 0.5 10*3/uL (ref 0.1–0.9)
Monocytes: 8 %
Neutrophils Absolute: 2.7 10*3/uL (ref 1.4–7.0)
Neutrophils: 49 %
Platelets: 266 10*3/uL (ref 150–450)
RBC: 5.42 x10E6/uL (ref 4.14–5.80)
RDW: 12.6 % (ref 11.6–15.4)
WBC: 5.6 10*3/uL (ref 3.4–10.8)

## 2020-07-07 LAB — CMP14+EGFR
ALT: 31 IU/L (ref 0–44)
AST: 21 IU/L (ref 0–40)
Albumin/Globulin Ratio: 1.9 (ref 1.2–2.2)
Albumin: 4.7 g/dL (ref 4.1–5.2)
Alkaline Phosphatase: 53 IU/L (ref 44–121)
BUN/Creatinine Ratio: 12 (ref 9–20)
BUN: 12 mg/dL (ref 6–20)
Bilirubin Total: 0.4 mg/dL (ref 0.0–1.2)
CO2: 25 mmol/L (ref 20–29)
Calcium: 9.8 mg/dL (ref 8.7–10.2)
Chloride: 99 mmol/L (ref 96–106)
Creatinine, Ser: 0.97 mg/dL (ref 0.76–1.27)
GFR calc Af Amer: 121 mL/min/{1.73_m2} (ref >59)
GFR calc non Af Amer: 105 mL/min/{1.73_m2} (ref >59)
Globulin, Total: 2.5 g/dL (ref 1.5–4.5)
Glucose: 100 mg/dL — ABNORMAL HIGH (ref 65–99)
Potassium: 4.5 mmol/L (ref 3.5–5.2)
Sodium: 138 mmol/L (ref 134–144)
Total Protein: 7.2 g/dL (ref 6.0–8.5)

## 2020-07-07 LAB — LIPID PANEL
Chol/HDL Ratio: 5.3 ratio — ABNORMAL HIGH (ref 0.0–5.0)
Cholesterol, Total: 203 mg/dL — ABNORMAL HIGH (ref 100–199)
HDL: 38 mg/dL — ABNORMAL LOW (ref >39)
LDL Chol Calc (NIH): 150 mg/dL — ABNORMAL HIGH (ref 0–99)
Triglycerides: 81 mg/dL (ref 0–149)
VLDL Cholesterol Cal: 15 mg/dL (ref 5–40)

## 2020-07-07 LAB — HEMOGLOBIN A1C
Est. average glucose Bld gHb Est-mCnc: 111 mg/dL
Hgb A1c MFr Bld: 5.5 % (ref 4.8–5.6)

## 2020-07-10 ENCOUNTER — Encounter: Payer: Self-pay | Admitting: Emergency Medicine

## 2020-07-10 ENCOUNTER — Other Ambulatory Visit: Payer: Self-pay

## 2020-07-10 ENCOUNTER — Ambulatory Visit (INDEPENDENT_AMBULATORY_CARE_PROVIDER_SITE_OTHER): Payer: 59 | Admitting: Emergency Medicine

## 2020-07-10 VITALS — BP 150/76 | HR 82 | Temp 98.3°F | Ht 69.0 in | Wt 194.2 lb

## 2020-07-10 DIAGNOSIS — Z0000 Encounter for general adult medical examination without abnormal findings: Secondary | ICD-10-CM

## 2020-07-10 DIAGNOSIS — L989 Disorder of the skin and subcutaneous tissue, unspecified: Secondary | ICD-10-CM

## 2020-07-10 DIAGNOSIS — Z0001 Encounter for general adult medical examination with abnormal findings: Secondary | ICD-10-CM

## 2020-07-10 NOTE — Progress Notes (Signed)
Jeremy Blanchard 29 y.o.   Chief Complaint  Patient presents with   Annual Exam    CPE    HISTORY OF PRESENT ILLNESS: This is a 29 y.o. male here for annual exam. Healthy male with a healthy lifestyle. Has no complaints or medical concerns today. Fully vaccinated against Covid.  HPI   Prior to Admission medications   Medication Sig Start Date End Date Taking? Authorizing Provider  acetaminophen (TYLENOL) 160 MG/5ML elixir Take 15 mg/kg by mouth every 4 (four) hours as needed for fever.    [provider]  cetirizine (ZYRTEC) 10 MG tablet Take 10 mg by mouth daily.    [provider]  fluticasone (FLONASE) 50 MCG/ACT nasal spray Place into both nostrils as needed for allergies or rhinitis.    [provider]  naproxen sodium (ALEVE) 220 MG tablet Take 220 mg by mouth as needed.    [provider]    Allergies  Allergen Reactions   Other     Patient Active Problem List   Diagnosis Date Noted   Allergic symptoms 07/17/2018    Past Medical History:  Diagnosis Date   Allergy    [employer] 07/09/2020   Hyperlipidemia    [employer] 07/09/2020   Seasonal allergies     Past Surgical History:  Procedure Laterality Date   BRAIN SURGERY      Social History   Socioeconomic History   Marital status: Single    Spouse name: Not on file   Number of children: Not on file   Years of education: Not on file   Highest education level: Not on file  Occupational History   Occupation: EMT  Tobacco Use   Smoking status: Never Smoker   Smokeless tobacco: Never Used  Substance and Sexual Activity   Alcohol use: Yes    Comment: socal drinker   Drug use: Never   Sexual activity: Yes  Other Topics Concern   Not on file  Social History Narrative   Not on file   Social Determinants of Health   Financial Resource Strain:    Difficulty of Paying Living Expenses: Not on file  Food Insecurity:    Worried About [place] in the Last Year: Not on file   Ran Out of Food in the Last Year: Not on file  Transportation Needs:    Lack of Transportation (Medical): Not on file   Lack of Transportation (Non-Medical): Not on file  Physical Activity:    Days of Exercise per Week: Not on file   Minutes of Exercise per Session: Not on file  Stress:    Feeling of Stress : Not on file  Social Connections:    Frequency of Communication with Friends and Family: Not on file   Frequency of Social Gatherings with Friends and Family: Not on file   Attends Religious Services: Not on file   Active Member of Clubs or Organizations: Not on file   Attends Archivist Meetings: Not on file   Marital Status: Not on file  Intimate Partner Violence:    Fear of Current or Ex-Partner: Not on file   Emotionally Abused: Not on file   Physically Abused: Not on file   Sexually Abused: Not on file    Family History  Problem Relation Age of Onset   Hypertension Mother    High Cholesterol Father    Hypertension Father    Hypertension Brother    Healthy Brother  Review of Systems  Constitutional: Negative.  Negative for chills and fever.  HENT: Negative.  Negative for congestion and sore throat.   Eyes: Negative.   Respiratory: Negative.  Negative for cough and shortness of breath.   Cardiovascular: Negative.  Negative for chest pain and palpitations.  Gastrointestinal: Negative.  Negative for abdominal pain, diarrhea, nausea and vomiting.  Genitourinary: Negative.  Negative for dysuria and hematuria.  Musculoskeletal: Negative.  Negative for back pain, joint pain and myalgias.  Skin: Negative.        Scalp lesion  Neurological: Negative.  Negative for dizziness and headaches.  Endo/Heme/Allergies: Negative.   All other systems reviewed and are negative.    Today's Vitals   07/10/20 1434 07/10/20 1436  BP: (!) 167/81 (!) 150/76  Pulse: 82   Temp: 98.3 F (36.8 C)   TempSrc:  Temporal   SpO2: 97%   Weight: 194 lb 3.2 oz (88.1 kg)   Height: 5\' 9"  (1.753 m)    Body mass index is 28.68 kg/m.   Physical Exam Vitals reviewed.  Constitutional:      Appearance: Normal appearance.  HENT:     Head: Normocephalic.     Mouth/Throat:     Mouth: Mucous membranes are moist.     Pharynx: Oropharynx is clear.  Eyes:     Extraocular Movements: Extraocular movements intact.     Conjunctiva/sclera: Conjunctivae normal.     Pupils: Pupils are equal, round, and reactive to light.  Neck:     Vascular: No carotid bruit.  Cardiovascular:     Rate and Rhythm: Normal rate and regular rhythm.     Pulses: Normal pulses.     Heart sounds: Normal heart sounds.  Pulmonary:     Effort: Pulmonary effort is normal.     Breath sounds: Normal breath sounds.  Abdominal:     General: Bowel sounds are normal. There is no distension.     Palpations: Abdomen is soft. There is no mass.     Tenderness: There is no abdominal tenderness.  Musculoskeletal:        General: Normal range of motion.     Cervical back: Normal range of motion and neck supple. No tenderness.  Lymphadenopathy:     Cervical: No cervical adenopathy.  Skin:    General: Skin is warm and dry.     Capillary Refill: Capillary refill takes less than 2 seconds.     Findings: Lesion (Flat hypopigmented lesion of scalp) present.  Neurological:     General: No focal deficit present.     Mental Status: He is alert and oriented to person, place, and time.  Psychiatric:        Mood and Affect: Mood normal.        Behavior: Behavior normal.      ASSESSMENT & PLAN: Sajan was seen today for annual exam and skin scab on top of head.  Diagnoses and all orders for this visit:  Routine general medical examination at a health care facility  Lesion of skin of scalp -     Ambulatory referral to Dermatology    Patient Instructions       If you have lab work done today you will be contacted with your lab results  within the next 2 weeks.  If you have not heard from [place] then please contact us. The fastest way to get your results is to register for My Chart.   IF you received an x-ray today, you will receive an  invoice from [facility]. Please contact [facility] at [phone] with questions or concerns regarding your invoice.   IF you received labwork today, you will receive an invoice from [facility]. Please contact [facility] at [phone] with questions or concerns regarding your invoice.   Our billing staff will not be able to assist you with questions regarding bills from these companies.  You will be contacted with the lab results as soon as they are available. The fastest way to get your results is to activate your My Chart account. Instructions are located on the last page of this paperwork. If you have not heard from [place] regarding the results in 2 weeks, please contact this office.       Health Maintenance, Male Adopting a healthy lifestyle and getting preventive care are important in promoting health and wellness. Ask your health care provider about:  The right schedule for you to have regular tests and exams.  Things you can do on your own to prevent diseases and keep yourself healthy. What should I know about diet, weight, and exercise? Eat a healthy diet   Eat a diet that includes plenty of vegetables, fruits, low-fat dairy products, and lean protein.  Do not eat a lot of foods that are high in solid fats, added sugars, or sodium. Maintain a healthy weight Body mass index (BMI) is a measurement that can be used to identify possible weight problems. It estimates body fat based on height and weight. Your health care provider can help determine your BMI and help you achieve or maintain a healthy weight. Get regular exercise Get regular exercise. This is one of the most important things you can do for your health. Most adults should:  Exercise for at least 150 minutes  each week. The exercise should increase your heart rate and make you sweat (moderate-intensity exercise).  Do strengthening exercises at least twice a week. This is in addition to the moderate-intensity exercise.  Spend less time sitting. Even light physical activity can be beneficial. Watch cholesterol and blood lipids Have your blood tested for lipids and cholesterol at 29 years of age, then have this test every 5 years. You may need to have your cholesterol levels checked more often if:  Your lipid or cholesterol levels are high.  You are older than 29 years of age.  You are at high risk for heart disease. What should I know about cancer screening? Many types of cancers can be detected early and may often be prevented. Depending on your health history and family history, you may need to have cancer screening at various ages. This may include screening for:  Colorectal cancer.  Prostate cancer.  Skin cancer.  Lung cancer. What should I know about heart disease, diabetes, and high blood pressure? Blood pressure and heart disease  High blood pressure causes heart disease and increases the risk of stroke. This is more likely to develop in people who have high blood pressure readings, are of African descent, or are overweight.  Talk with your health care provider about your target blood pressure readings.  Have your blood pressure checked: ? Every 3-5 years if you are 67-61 years of age. ? Every year if you are 70 years old or older.  If you are between the ages of 7 and 45 and are a current or former smoker, ask your health care provider if you should have a one-time screening for abdominal aortic aneurysm (AAA). Diabetes Have regular diabetes screenings. This checks your fasting blood  sugar level. Have the screening done:  Once every three years after age 60 if you are at a normal weight and have a low risk for diabetes.  More often and at a younger age if you are overweight  or have a high risk for diabetes. What should I know about preventing infection? Hepatitis B If you have a higher risk for hepatitis B, you should be screened for this virus. Talk with your health care provider to find out if you are at risk for hepatitis B infection. Hepatitis C Blood testing is recommended for:  Everyone born from [birth year] through 1965.  Anyone with known risk factors for hepatitis C. Sexually transmitted infections (STIs)  You should be screened each year for STIs, including gonorrhea and chlamydia, if: ? You are sexually active and are younger than 29 years of age. ? You are older than 29 years of age and your health care provider tells you that you are at risk for this type of infection. ? Your sexual activity has changed since you were last screened, and you are at increased risk for chlamydia or gonorrhea. Ask your health care provider if you are at risk.  Ask your health care provider about whether you are at high risk for HIV. Your health care provider may recommend a prescription medicine to help prevent HIV infection. If you choose to take medicine to prevent HIV, you should first get tested for HIV. You should then be tested every 3 months for as long as you are taking the medicine. Follow these instructions at home: Lifestyle  Do not use any products that contain nicotine or tobacco, such as cigarettes, e-cigarettes, and chewing tobacco. If you need help quitting, ask your health care provider.  Do not use street drugs.  Do not share needles.  Ask your health care provider for help if you need support or information about quitting drugs. Alcohol use  Do not drink alcohol if your health care provider tells you not to drink.  If you drink alcohol: ? Limit how much you have to 0-2 drinks a day. ? Be aware of how much alcohol is in your drink. In the [place] one drink equals one 12 oz bottle of beer (355 mL), one 5 oz glass of wine (148 mL), or one 1 oz glass of  hard liquor (44 mL). General instructions  Schedule regular health, dental, and eye exams.  Stay current with your vaccines.  Tell your health care provider if: ? You often feel depressed. ? You have ever been abused or do not feel safe at home. Summary  Adopting a healthy lifestyle and getting preventive care are important in promoting health and wellness.  Follow your health care provider's instructions about healthy diet, exercising, and getting tested or screened for diseases.  Follow your health care provider's instructions on monitoring your cholesterol and blood pressure. This information is not intended to replace advice given to you by your health care provider. Make sure you discuss any questions you have with your health care provider. Document Revised: 08/26/2018 Document Reviewed: 08/26/2018 Elsevier Patient Education  2020 Elsevier Inc.      Agustina Caroli, MD [facility]

## 2020-07-10 NOTE — Patient Instructions (Addendum)
   If you have lab work done today you will be contacted with your lab results within the next 2 weeks.  If you have not heard from us then please contact us. The fastest way to get your results is to register for My Chart.   IF you received an x-ray today, you will receive an invoice from [facility]. Please contact [facility] at [phone] with questions or concerns regarding your invoice.   IF you received labwork today, you will receive an invoice from [facility]. Please contact [facility] at [phone] with questions or concerns regarding your invoice.   Our billing staff will not be able to assist you with questions regarding bills from these companies.  You will be contacted with the lab results as soon as they are available. The fastest way to get your results is to activate your My Chart account. Instructions are located on the last page of this paperwork. If you have not heard from us regarding the results in 2 weeks, please contact this office.      Health Maintenance, Male Adopting a healthy lifestyle and getting preventive care are important in promoting health and wellness. Ask your health care provider about:  The right schedule for you to have regular tests and exams.  Things you can do on your own to prevent diseases and keep yourself healthy. What should I know about diet, weight, and exercise? Eat a healthy diet   Eat a diet that includes plenty of vegetables, fruits, low-fat dairy products, and lean protein.  Do not eat a lot of foods that are high in solid fats, added sugars, or sodium. Maintain a healthy weight Body mass index (BMI) is a measurement that can be used to identify possible weight problems. It estimates body fat based on height and weight. Your health care provider can help determine your BMI and help you achieve or maintain a healthy weight. Get regular exercise Get regular exercise. This is one of the most important things you  can do for your health. Most adults should:  Exercise for at least 150 minutes each week. The exercise should increase your heart rate and make you sweat (moderate-intensity exercise).  Do strengthening exercises at least twice a week. This is in addition to the moderate-intensity exercise.  Spend less time sitting. Even light physical activity can be beneficial. Watch cholesterol and blood lipids Have your blood tested for lipids and cholesterol at 29 years of age, then have this test every 5 years. You may need to have your cholesterol levels checked more often if:  Your lipid or cholesterol levels are high.  You are older than 29 years of age.  You are at high risk for heart disease. What should I know about cancer screening? Many types of cancers can be detected early and may often be prevented. Depending on your health history and family history, you may need to have cancer screening at various ages. This may include screening for:  Colorectal cancer.  Prostate cancer.  Skin cancer.  Lung cancer. What should I know about heart disease, diabetes, and high blood pressure? Blood pressure and heart disease  High blood pressure causes heart disease and increases the risk of stroke. This is more likely to develop in people who have high blood pressure readings, are of African descent, or are overweight.  Talk with your health care provider about your target blood pressure readings.  Have your blood pressure checked: ? Every 3-5 years if you are 18-39   years of age. ? Every year if you are 40 years old or older.  If you are between the ages of 65 and 75 and are a current or former smoker, ask your health care provider if you should have a one-time screening for abdominal aortic aneurysm (AAA). Diabetes Have regular diabetes screenings. This checks your fasting blood sugar level. Have the screening done:  Once every three years after age 45 if you are at a normal weight and have  a low risk for diabetes.  More often and at a younger age if you are overweight or have a high risk for diabetes. What should I know about preventing infection? Hepatitis B If you have a higher risk for hepatitis B, you should be screened for this virus. Talk with your health care provider to find out if you are at risk for hepatitis B infection. Hepatitis C Blood testing is recommended for:  Everyone born from 1945 through 1965.  Anyone with known risk factors for hepatitis C. Sexually transmitted infections (STIs)  You should be screened each year for STIs, including gonorrhea and chlamydia, if: ? You are sexually active and are younger than 29 years of age. ? You are older than 29 years of age and your health care provider tells you that you are at risk for this type of infection. ? Your sexual activity has changed since you were last screened, and you are at increased risk for chlamydia or gonorrhea. Ask your health care provider if you are at risk.  Ask your health care provider about whether you are at high risk for HIV. Your health care provider may recommend a prescription medicine to help prevent HIV infection. If you choose to take medicine to prevent HIV, you should first get tested for HIV. You should then be tested every 3 months for as long as you are taking the medicine. Follow these instructions at home: Lifestyle  Do not use any products that contain nicotine or tobacco, such as cigarettes, e-cigarettes, and chewing tobacco. If you need help quitting, ask your health care provider.  Do not use street drugs.  Do not share needles.  Ask your health care provider for help if you need support or information about quitting drugs. Alcohol use  Do not drink alcohol if your health care provider tells you not to drink.  If you drink alcohol: ? Limit how much you have to 0-2 drinks a day. ? Be aware of how much alcohol is in your drink. In the [place] one drink equals one 12  oz bottle of beer (355 mL), one 5 oz glass of wine (148 mL), or one 1 oz glass of hard liquor (44 mL). General instructions  Schedule regular health, dental, and eye exams.  Stay current with your vaccines.  Tell your health care provider if: ? You often feel depressed. ? You have ever been abused or do not feel safe at home. Summary  Adopting a healthy lifestyle and getting preventive care are important in promoting health and wellness.  Follow your health care provider's instructions about healthy diet, exercising, and getting tested or screened for diseases.  Follow your health care provider's instructions on monitoring your cholesterol and blood pressure. This information is not intended to replace advice given to you by your health care provider. Make sure you discuss any questions you have with your health care provider. Document Revised: 08/26/2018 Document Reviewed: 08/26/2018 Elsevier Patient Education  2020 Elsevier Inc.  

## 2020-12-14 ENCOUNTER — Other Ambulatory Visit: Payer: Self-pay

## 2020-12-14 ENCOUNTER — Ambulatory Visit (INDEPENDENT_AMBULATORY_CARE_PROVIDER_SITE_OTHER): Payer: 59 | Admitting: Physician Assistant

## 2020-12-14 ENCOUNTER — Encounter: Payer: Self-pay | Admitting: Physician Assistant

## 2020-12-14 DIAGNOSIS — L219 Seborrheic dermatitis, unspecified: Secondary | ICD-10-CM | POA: Diagnosis not present

## 2020-12-14 DIAGNOSIS — Z1283 Encounter for screening for malignant neoplasm of skin: Secondary | ICD-10-CM | POA: Diagnosis not present

## 2020-12-14 DIAGNOSIS — L814 Other melanin hyperpigmentation: Secondary | ICD-10-CM | POA: Diagnosis not present

## 2020-12-14 DIAGNOSIS — D485 Neoplasm of uncertain behavior of skin: Secondary | ICD-10-CM | POA: Diagnosis not present

## 2020-12-14 MED ORDER — KETOCONAZOLE 2 % EX CREA: 1.0000 "application " | TOPICAL_CREAM | Freq: Every day | CUTANEOUS | 3 refills | Status: AC

## 2020-12-14 NOTE — Progress Notes (Signed)
   New Patient   Subjective  Jeremy Blanchard is a 30 y.o. male who presents for the following: Skin Problem (Crown of scalp x 2 years- peels and bleeds sometimes).   The following portions of the chart were reviewed this encounter and updated as appropriate:  Tobacco  Allergies  Meds  Problems  Med Hx  Surg Hx  Fam Hx      Objective  Well appearing patient in no apparent distress; mood and affect are within normal limits.  All skin waist up examined.  Objective  Scalp and back: No atypical nevi   Objective  Anterior (Face): Erythematous plaques with greasy scale.   Objective  Mid Occipital Scalp: Dense plaque with excoriated center      Assessment & Plan  Screening exam for skin cancer Scalp and back  Skin exams  Seborrheic dermatitis Anterior (Face)  Mix with OTC hydrocortisone cream. Apply daily when flaring.  ketoconazole (NIZORAL) 2 % cream - Anterior (Face)  Neoplasm of uncertain behavior of skin Mid Occipital Scalp  Skin / nail biopsy Type of biopsy: tangential   Informed consent: discussed and consent obtained   Timeout: patient name, date of birth, surgical site, and procedure verified   Procedure prep:  Patient was prepped and draped in usual sterile fashion (Non sterile) Prep type:  Chlorhexidine Anesthesia: the lesion was anesthetized in a standard fashion   Anesthetic:  1% lidocaine w/ epinephrine 1-100,000 local infiltration Instrument used: flexible razor blade   Hemostasis achieved with: electrodesiccation   Outcome: patient tolerated procedure well   Post-procedure details: wound care instructions given    Specimen 1 - Surgical pathology Differential Diagnosis: bcc vs scc cautery Check Margins: No   Lentigines - Scattered tan macules - Due to sun exposure - Benign-appering, observe - Recommend daily broad spectrum sunscreen SPF 30+ to sun-exposed areas, reapply every 2 hours as needed. - Call for any changes    I,  Courtnay Petrilla, PA-C, have reviewed all documentation for this visit. The documentation on 12/14/20 for the exam, diagnosis, procedures, and orders are all accurate and complete.

## 2020-12-14 NOTE — Patient Instructions (Signed)

## 2021-12-18 ENCOUNTER — Ambulatory Visit: Payer: 59 | Admitting: Physician Assistant

## 2022-01-23 ENCOUNTER — Ambulatory Visit (INDEPENDENT_AMBULATORY_CARE_PROVIDER_SITE_OTHER): Payer: 59 | Admitting: Emergency Medicine

## 2022-01-23 ENCOUNTER — Encounter: Payer: Self-pay | Admitting: Emergency Medicine

## 2022-01-23 VITALS — BP 136/82 | HR 83 | Temp 98.1°F | Ht 69.0 in | Wt 192.1 lb

## 2022-01-23 DIAGNOSIS — Z13220 Encounter for screening for lipoid disorders: Secondary | ICD-10-CM

## 2022-01-23 DIAGNOSIS — Z0000 Encounter for general adult medical examination without abnormal findings: Secondary | ICD-10-CM

## 2022-01-23 DIAGNOSIS — Z1159 Encounter for screening for other viral diseases: Secondary | ICD-10-CM

## 2022-01-23 DIAGNOSIS — Z13228 Encounter for screening for other metabolic disorders: Secondary | ICD-10-CM | POA: Diagnosis not present

## 2022-01-23 DIAGNOSIS — Z130 Encounter for screening for diseases of the blood and blood-forming organs and certain disorders involving the immune mechanism: Secondary | ICD-10-CM | POA: Diagnosis not present

## 2022-01-23 DIAGNOSIS — Z1329 Encounter for screening for other suspected endocrine disorder: Secondary | ICD-10-CM

## 2022-01-23 LAB — CBC WITH DIFFERENTIAL/PLATELET
Basophils Absolute: 0 10*3/uL (ref 0.0–0.1)
Basophils Relative: 0.7 % (ref 0.0–3.0)
Eosinophils Absolute: 0.1 10*3/uL (ref 0.0–0.7)
Eosinophils Relative: 1.9 % (ref 0.0–5.0)
HCT: 43.6 % (ref 39.0–52.0)
Hemoglobin: 15 g/dL (ref 13.0–17.0)
Lymphocytes Relative: 32.9 % (ref 12.0–46.0)
Lymphs Abs: 1.8 10*3/uL (ref 0.7–4.0)
MCHC: 34.4 g/dL (ref 30.0–36.0)
MCV: 85.5 fl (ref 78.0–100.0)
Monocytes Absolute: 0.4 10*3/uL (ref 0.1–1.0)
Monocytes Relative: 6.8 % (ref 3.0–12.0)
Neutro Abs: 3.1 10*3/uL (ref 1.4–7.7)
Neutrophils Relative %: 57.7 % (ref 43.0–77.0)
Platelets: 241 10*3/uL (ref 150.0–400.0)
RBC: 5.1 Mil/uL (ref 4.22–5.81)
RDW: 13.2 % (ref 11.5–15.5)
WBC: 5.4 10*3/uL (ref 4.0–10.5)

## 2022-01-23 LAB — LIPID PANEL
Cholesterol: 190 mg/dL (ref 0–200)
HDL: 40.2 mg/dL (ref >39.00)
LDL Cholesterol: 117 mg/dL — ABNORMAL HIGH (ref 0–99)
NonHDL: 149.83
Total CHOL/HDL Ratio: 5
Triglycerides: 164 mg/dL — ABNORMAL HIGH (ref 0.0–149.0)
VLDL: 32.8 mg/dL (ref 0.0–40.0)

## 2022-01-23 LAB — COMPREHENSIVE METABOLIC PANEL
ALT: 44 U/L (ref 0–53)
AST: 28 U/L (ref 0–37)
Albumin: 4.5 g/dL (ref 3.5–5.2)
Alkaline Phosphatase: 48 U/L (ref 39–117)
BUN: 14 mg/dL (ref 6–23)
CO2: 30 mEq/L (ref 19–32)
Calcium: 9.4 mg/dL (ref 8.4–10.5)
Chloride: 101 mEq/L (ref 96–112)
Creatinine, Ser: 0.84 mg/dL (ref 0.40–1.50)
GFR: 116.96 mL/min (ref >60.00)
Glucose, Bld: 98 mg/dL (ref 70–99)
Potassium: 4.2 mEq/L (ref 3.5–5.1)
Sodium: 137 mEq/L (ref 135–145)
Total Bilirubin: 0.5 mg/dL (ref 0.2–1.2)
Total Protein: 7.5 g/dL (ref 6.0–8.3)

## 2022-01-23 LAB — HEMOGLOBIN A1C: Hgb A1c MFr Bld: 5.5 % (ref 4.6–6.5)

## 2022-01-23 NOTE — Patient Instructions (Signed)
Health Maintenance, Male Adopting a healthy lifestyle and getting preventive care are important in promoting health and wellness. Ask your health care provider about: The right schedule for you to have regular tests and exams. Things you can do on your own to prevent diseases and keep yourself healthy. What should I know about diet, weight, and exercise? Eat a healthy diet  Eat a diet that includes plenty of vegetables, fruits, low-fat dairy products, and lean protein. Do not eat a lot of foods that are high in solid fats, added sugars, or sodium. Maintain a healthy weight Body mass index (BMI) is a measurement that can be used to identify possible weight problems. It estimates body fat based on height and weight. Your health care provider can help determine your BMI and help you achieve or maintain a healthy weight. Get regular exercise Get regular exercise. This is one of the most important things you can do for your health. Most adults should: Exercise for at least 150 minutes each week. The exercise should increase your heart rate and make you sweat (moderate-intensity exercise). Do strengthening exercises at least twice a week. This is in addition to the moderate-intensity exercise. Spend less time sitting. Even light physical activity can be beneficial. Watch cholesterol and blood lipids Have your blood tested for lipids and cholesterol at 31 years of age, then have this test every 5 years. You may need to have your cholesterol levels checked more often if: Your lipid or cholesterol levels are high. You are older than 31 years of age. You are at high risk for heart disease. What should I know about cancer screening? Many types of cancers can be detected early and may often be prevented. Depending on your health history and family history, you may need to have cancer screening at various ages. This may include screening for: Colorectal cancer. Prostate cancer. Skin cancer. Lung  cancer. What should I know about heart disease, diabetes, and high blood pressure? Blood pressure and heart disease High blood pressure causes heart disease and increases the risk of stroke. This is more likely to develop in people who have high blood pressure readings or are overweight. Talk with your health care provider about your target blood pressure readings. Have your blood pressure checked: Every 3-5 years if you are 18-39 years of age. Every year if you are 40 years old or older. If you are between the ages of 65 and 75 and are a current or former smoker, ask your health care provider if you should have a one-time screening for abdominal aortic aneurysm (AAA). Diabetes Have regular diabetes screenings. This checks your fasting blood sugar level. Have the screening done: Once every three years after age 45 if you are at a normal weight and have a low risk for diabetes. More often and at a younger age if you are overweight or have a high risk for diabetes. What should I know about preventing infection? Hepatitis B If you have a higher risk for hepatitis B, you should be screened for this virus. Talk with your health care provider to find out if you are at risk for hepatitis B infection. Hepatitis C Blood testing is recommended for: Everyone born from 1945 through 1965. Anyone with known risk factors for hepatitis C. Sexually transmitted infections (STIs) You should be screened each year for STIs, including gonorrhea and chlamydia, if: You are sexually active and are younger than 31 years of age. You are older than 31 years of age and your   health care provider tells you that you are at risk for this type of infection. Your sexual activity has changed since you were last screened, and you are at increased risk for chlamydia or gonorrhea. Ask your health care provider if you are at risk. Ask your health care provider about whether you are at high risk for HIV. Your health care provider  may recommend a prescription medicine to help prevent HIV infection. If you choose to take medicine to prevent HIV, you should first get tested for HIV. You should then be tested every 3 months for as long as you are taking the medicine. Follow these instructions at home: Alcohol use Do not drink alcohol if your health care provider tells you not to drink. If you drink alcohol: Limit how much you have to 0-2 drinks a day. Know how much alcohol is in your drink. In the [place] one drink equals one 12 oz bottle of beer (355 mL), one 5 oz glass of wine (148 mL), or one 1 oz glass of hard liquor (44 mL). Lifestyle Do not use any products that contain nicotine or tobacco. These products include cigarettes, chewing tobacco, and vaping devices, such as e-cigarettes. If you need help quitting, ask your health care provider. Do not use street drugs. Do not share needles. Ask your health care provider for help if you need support or information about quitting drugs. General instructions Schedule regular health, dental, and eye exams. Stay current with your vaccines. Tell your health care provider if: You often feel depressed. You have ever been abused or do not feel safe at home. Summary Adopting a healthy lifestyle and getting preventive care are important in promoting health and wellness. Follow your health care provider's instructions about healthy diet, exercising, and getting tested or screened for diseases. Follow your health care provider's instructions on monitoring your cholesterol and blood pressure. This information is not intended to replace advice given to you by your health care provider. Make sure you discuss any questions you have with your health care provider. Document Revised: 01/22/2021 Document Reviewed: 01/22/2021 Elsevier Patient Education  2023 Elsevier Inc.  

## 2022-01-23 NOTE — Progress Notes (Signed)
Jeremy Blanchard ?31 y.o. ? ? ?Chief Complaint  ?Patient presents with  ? Annual Exam  ?  No concerns  ? ? ?HISTORY OF PRESENT ILLNESS: ?This is a 31 y.o. male here for annual exam. ?Healthy male with a healthy lifestyle.  Non-smoker. ?Has no complaints or medical concerns today. ? ?HPI ? ? ?Prior to Admission medications   ?Medication Sig Start Date End Date Taking? Authorizing Provider  ?cetirizine (ZYRTEC) 10 MG tablet Take 10 mg by mouth daily.   Yes [provider]  ?naproxen sodium (ALEVE) 220 MG tablet Take 220 mg by mouth as needed.   Yes [provider]  ? ? ?No Known Allergies ? ?There are no problems to display for this patient. ? ? ?Past Medical History:  ?Diagnosis Date  ? Allergy   ? [employer] 07/09/2020  ? Hyperlipidemia   ? [employer] 07/09/2020  ? Seasonal allergies   ? ? ?Past Surgical History:  ?Procedure Laterality Date  ? BRAIN SURGERY    ? ? ?Social History  ? ?Socioeconomic History  ? Marital status: Single  ?  Spouse name: Not on file  ? Number of children: Not on file  ? Years of education: Not on file  ? Highest education level: Not on file  ?Occupational History  ? Occupation: EMT  ?Tobacco Use  ? Smoking status: Never  ? Smokeless tobacco: Never  ?Substance and Sexual Activity  ? Alcohol use: Yes  ?  Comment: socal drinker  ? Drug use: Never  ? Sexual activity: Yes  ?Other Topics Concern  ? Not on file  ?Social History Narrative  ? Not on file  ? ?Social Determinants of Health  ? ?Financial Resource Strain: Not on file  ?Food Insecurity: Not on file  ?Transportation Needs: Not on file  ?Physical Activity: Not on file  ?Stress: Not on file  ?Social Connections: Not on file  ?Intimate Partner Violence: Not on file  ? ? ?Family History  ?Problem Relation Age of Onset  ? Hypertension Mother   ? High Cholesterol Father   ? Hypertension Father   ? Hypertension Brother   ? Healthy Brother   ? ? ? ?Review of Systems  ?Constitutional: Negative.  Negative for chills and fever.  ?HENT:  Negative.  Negative for congestion and sore throat.   ?Respiratory: Negative.  Negative for cough and shortness of breath.   ?Cardiovascular: Negative.  Negative for chest pain and palpitations.  ?Gastrointestinal: Negative.  Negative for abdominal pain, diarrhea, nausea and vomiting.  ?Genitourinary: Negative.  Negative for dysuria.  ?Musculoskeletal: Negative.   ?Skin: Negative.  Negative for rash.  ?Neurological: Negative.  Negative for dizziness and headaches.  ?All other systems reviewed and are negative. ? ?Today's Vitals  ? 01/23/22 1034  ?BP: 136/82  ?Pulse: 83  ?Temp: 98.1 ?F (36.7 ?C)  ?TempSrc: Oral  ?SpO2: 97%  ?Weight: 192 lb 2 oz (87.1 kg)  ?Height: '5\' 9"'$  (1.753 m)  ? ?Body mass index is 28.37 kg/m?. ? ?Physical Exam ?Vitals reviewed.  ?Constitutional:   ?   Appearance: Normal appearance.  ?HENT:  ?   Head: Normocephalic.  ?   Right Ear: Tympanic membrane, ear canal and external ear normal.  ?   Left Ear: Tympanic membrane, ear canal and external ear normal.  ?   Mouth/Throat:  ?   Mouth: Mucous membranes are moist.  ?   Pharynx: Oropharynx is clear.  ?Eyes:  ?   Extraocular Movements: Extraocular movements intact.  ?  Conjunctiva/sclera: Conjunctivae normal.  ?   Pupils: Pupils are equal, round, and reactive to light.  ?Cardiovascular:  ?   Rate and Rhythm: Normal rate and regular rhythm.  ?   Pulses: Normal pulses.  ?   Heart sounds: Normal heart sounds.  ?Pulmonary:  ?   Effort: Pulmonary effort is normal.  ?   Breath sounds: Normal breath sounds.  ?Abdominal:  ?   General: There is no distension.  ?   Palpations: Abdomen is soft.  ?   Tenderness: There is no abdominal tenderness.  ?Musculoskeletal:  ?   Cervical back: No tenderness.  ?   Right lower leg: No edema.  ?   Left lower leg: No edema.  ?Lymphadenopathy:  ?   Cervical: No cervical adenopathy.  ?Skin: ?   General: Skin is warm and dry.  ?   Capillary Refill: Capillary refill takes less than 2 seconds.  ?Neurological:  ?   General: No focal  deficit present.  ?   Mental Status: He is alert and oriented to person, place, and time.  ?Psychiatric:     ?   Mood and Affect: Mood normal.     ?   Behavior: Behavior normal.  ? ? ? ?ASSESSMENT & PLAN: ?Problem List Items Addressed This Visit   ?None ?Visit Diagnoses   ? ? Routine general medical examination at a health care facility    -  Primary  ? Need for hepatitis C screening test      ? Relevant Orders  ? Hepatitis C antibody screen  ? Screening for deficiency anemia      ? Relevant Orders  ? CBC with Differential  ? Screening for lipoid disorders      ? Relevant Orders  ? Lipid panel  ? Screening for endocrine, metabolic and immunity disorder      ? Relevant Orders  ? Comprehensive metabolic panel  ? Hemoglobin A1c  ? ?  ? ?Modifiable risk factors discussed with patient. ?Anticipatory guidance according to age provided. ?The following topics were also discussed: ?Social Determinants of Health ?Smoking.  Non-smoker ?Diet and nutrition.  Healthy eating habits ?Benefits of exercise.  Exercises regularly ?Cancer family history review ?Vaccinations recommendations ?Cardiovascular risk assessment and need for blood work ?Mental health including depression and anxiety ?Fall and accident prevention ? ?Patient Instructions  ?Health Maintenance, Male ?Adopting a healthy lifestyle and getting preventive care are important in promoting health and wellness. Ask your health care provider about: ?The right schedule for you to have regular tests and exams. ?Things you can do on your own to prevent diseases and keep yourself healthy. ?What should I know about diet, weight, and exercise? ?Eat a healthy diet ? ?Eat a diet that includes plenty of vegetables, fruits, low-fat dairy products, and lean protein. ?Do not eat a lot of foods that are high in solid fats, added sugars, or sodium. ?Maintain a healthy weight ?Body mass index (BMI) is a measurement that can be used to identify possible weight problems. It estimates body  fat based on height and weight. Your health care provider can help determine your BMI and help you achieve or maintain a healthy weight. ?Get regular exercise ?Get regular exercise. This is one of the most important things you can do for your health. Most adults should: ?Exercise for at least 150 minutes each week. The exercise should increase your heart rate and make you sweat (moderate-intensity exercise). ?Do strengthening exercises at least twice a week. This is in  addition to the moderate-intensity exercise. ?Spend less time sitting. Even light physical activity can be beneficial. ?Watch cholesterol and blood lipids ?Have your blood tested for lipids and cholesterol at 31 years of age, then have this test every 5 years. ?You may need to have your cholesterol levels checked more often if: ?Your lipid or cholesterol levels are high. ?You are older than 31 years of age. ?You are at high risk for heart disease. ?What should I know about cancer screening? ?Many types of cancers can be detected early and may often be prevented. Depending on your health history and family history, you may need to have cancer screening at various ages. This may include screening for: ?Colorectal cancer. ?Prostate cancer. ?Skin cancer. ?Lung cancer. ?What should I know about heart disease, diabetes, and high blood pressure? ?Blood pressure and heart disease ?High blood pressure causes heart disease and increases the risk of stroke. This is more likely to develop in people who have high blood pressure readings or are overweight. ?Talk with your health care provider about your target blood pressure readings. ?Have your blood pressure checked: ?Every 3-5 years if you are 86-37 years of age. ?Every year if you are 80 years old or older. ?If you are between the ages of 66 and 29 and are a current or former smoker, ask your health care provider if you should have a one-time screening for abdominal aortic aneurysm (AAA). ?Diabetes ?Have  regular diabetes screenings. This checks your fasting blood sugar level. Have the screening done: ?Once every three years after age 83 if you are at a normal weight and have a low risk for diabetes. ?More o

## 2022-01-24 LAB — HEPATITIS C ANTIBODY
Hepatitis C Ab: NONREACTIVE
SIGNAL TO CUT-OFF: 0.04 (ref <1.00)

## 2022-03-27 DIAGNOSIS — H6121 Impacted cerumen, right ear: Secondary | ICD-10-CM | POA: Diagnosis not present

## 2022-03-27 DIAGNOSIS — H9201 Otalgia, right ear: Secondary | ICD-10-CM | POA: Diagnosis not present

## 2022-05-02 ENCOUNTER — Ambulatory Visit: Payer: 59 | Admitting: Emergency Medicine

## 2022-06-10 ENCOUNTER — Telehealth: Payer: Self-pay

## 2022-06-10 DIAGNOSIS — B019 Varicella without complication: Secondary | ICD-10-CM

## 2022-06-10 DIAGNOSIS — Z111 Encounter for screening for respiratory tuberculosis: Secondary | ICD-10-CM

## 2022-06-10 DIAGNOSIS — Z789 Other specified health status: Secondary | ICD-10-CM

## 2022-06-10 NOTE — Telephone Encounter (Signed)
Called patient and inform him that labs has been ordered and faxed to [place]

## 2022-06-10 NOTE — Telephone Encounter (Addendum)
Patient called in wanting to schedule a vaccines for school. Jeremy Blanchard is needing a TB test and Varicella titer. Asked for it to be faxed to [facility] - Address: [address] [place], [place], [place] [address] (ph: [phone])

## 2022-06-10 NOTE — Telephone Encounter (Signed)
Okay to order?

## 2022-06-11 NOTE — Telephone Encounter (Signed)
Lab orders need to be faxed again - did not receive - fax:  [phone]

## 2022-06-11 NOTE — Telephone Encounter (Signed)
Faxed lab orders re sent to [facility] , confirmation received

## 2022-06-12 ENCOUNTER — Other Ambulatory Visit: Payer: Self-pay | Admitting: Emergency Medicine

## 2022-06-17 LAB — QUANTIFERON-TB GOLD PLUS
QuantiFERON Mitogen Value: 2.52 IU/mL
QuantiFERON Nil Value: 0.02 IU/mL
QuantiFERON TB1 Ag Value: 0.01 IU/mL
QuantiFERON TB2 Ag Value: 0.02 IU/mL
QuantiFERON-TB Gold Plus: NEGATIVE

## 2022-06-17 LAB — VARICELLA ZOSTER ANTIBODY, IGG: Varicella zoster IgG: 2926 index (ref >165)

## 2023-02-23 DIAGNOSIS — R030 Elevated blood-pressure reading, without diagnosis of hypertension: Secondary | ICD-10-CM | POA: Diagnosis not present

## 2023-02-23 DIAGNOSIS — J029 Acute pharyngitis, unspecified: Secondary | ICD-10-CM | POA: Diagnosis not present

## 2023-02-23 DIAGNOSIS — Z20822 Contact with and (suspected) exposure to covid-19: Secondary | ICD-10-CM | POA: Diagnosis not present

## 2023-03-01 DIAGNOSIS — R051 Acute cough: Secondary | ICD-10-CM | POA: Diagnosis not present

## 2023-03-01 DIAGNOSIS — J069 Acute upper respiratory infection, unspecified: Secondary | ICD-10-CM | POA: Diagnosis not present

## 2023-04-16 ENCOUNTER — Encounter: Payer: Commercial Managed Care - PPO | Admitting: Emergency Medicine

## 2023-04-17 ENCOUNTER — Encounter: Payer: Self-pay | Admitting: Emergency Medicine

## 2023-04-17 ENCOUNTER — Ambulatory Visit (INDEPENDENT_AMBULATORY_CARE_PROVIDER_SITE_OTHER): Payer: 59 | Admitting: Emergency Medicine

## 2023-04-17 VITALS — BP 132/88 | HR 69 | Temp 98.1°F | Ht 69.0 in | Wt 194.5 lb

## 2023-04-17 DIAGNOSIS — Z0001 Encounter for general adult medical examination with abnormal findings: Secondary | ICD-10-CM | POA: Diagnosis not present

## 2023-04-17 DIAGNOSIS — I10 Essential (primary) hypertension: Secondary | ICD-10-CM | POA: Diagnosis not present

## 2023-04-17 DIAGNOSIS — Z130 Encounter for screening for diseases of the blood and blood-forming organs and certain disorders involving the immune mechanism: Secondary | ICD-10-CM

## 2023-04-17 DIAGNOSIS — Z13220 Encounter for screening for lipoid disorders: Secondary | ICD-10-CM | POA: Diagnosis not present

## 2023-04-17 LAB — CBC WITH DIFFERENTIAL/PLATELET
Basophils Absolute: 0 10*3/uL (ref 0.0–0.1)
Basophils Relative: 0.7 % (ref 0.0–3.0)
Eosinophils Absolute: 0.1 10*3/uL (ref 0.0–0.7)
Eosinophils Relative: 1.7 % (ref 0.0–5.0)
HCT: 44.6 % (ref 39.0–52.0)
Hemoglobin: 14.8 g/dL (ref 13.0–17.0)
Lymphocytes Relative: 34.1 % (ref 12.0–46.0)
Lymphs Abs: 2 10*3/uL (ref 0.7–4.0)
MCHC: 33.3 g/dL (ref 30.0–36.0)
MCV: 86.1 fl (ref 78.0–100.0)
Monocytes Absolute: 0.5 10*3/uL (ref 0.1–1.0)
Monocytes Relative: 8 % (ref 3.0–12.0)
Neutro Abs: 3.3 10*3/uL (ref 1.4–7.7)
Neutrophils Relative %: 55.5 % (ref 43.0–77.0)
Platelets: 245 10*3/uL (ref 150.0–400.0)
RBC: 5.18 Mil/uL (ref 4.22–5.81)
RDW: 13.5 % (ref 11.5–15.5)
WBC: 6 10*3/uL (ref 4.0–10.5)

## 2023-04-17 LAB — LIPID PANEL
Cholesterol: 162 mg/dL (ref 0–200)
HDL: 34.3 mg/dL — ABNORMAL LOW (ref >39.00)
LDL Cholesterol: 110 mg/dL — ABNORMAL HIGH (ref 0–99)
NonHDL: 127.35
Total CHOL/HDL Ratio: 5
Triglycerides: 89 mg/dL (ref 0.0–149.0)
VLDL: 17.8 mg/dL (ref 0.0–40.0)

## 2023-04-17 LAB — COMPREHENSIVE METABOLIC PANEL
ALT: 31 U/L (ref 0–53)
AST: 24 U/L (ref 0–37)
Albumin: 4.5 g/dL (ref 3.5–5.2)
Alkaline Phosphatase: 48 U/L (ref 39–117)
BUN: 17 mg/dL (ref 6–23)
CO2: 29 mEq/L (ref 19–32)
Calcium: 9.4 mg/dL (ref 8.4–10.5)
Chloride: 102 mEq/L (ref 96–112)
Creatinine, Ser: 0.97 mg/dL (ref 0.40–1.50)
GFR: 103.8 mL/min (ref >60.00)
Glucose, Bld: 102 mg/dL — ABNORMAL HIGH (ref 70–99)
Potassium: 4.2 mEq/L (ref 3.5–5.1)
Sodium: 137 mEq/L (ref 135–145)
Total Bilirubin: 0.5 mg/dL (ref 0.2–1.2)
Total Protein: 7.3 g/dL (ref 6.0–8.3)

## 2023-04-17 LAB — HEMOGLOBIN A1C: Hgb A1c MFr Bld: 5.6 % (ref 4.6–6.5)

## 2023-04-17 MED ORDER — LOSARTAN POTASSIUM 25 MG PO TABS
25.0000 mg | ORAL_TABLET | Freq: Every day | ORAL | 3 refills | Status: DC
Start: 2023-04-17 — End: 2023-07-08
  Filled 2023-05-14: qty 90, 90d supply, fill #0

## 2023-04-17 NOTE — Assessment & Plan Note (Signed)
Elevated blood pressure readings in the office and at work Cardiovascular risks associated with uncontrolled hypertension discussed Dietary approaches to stop hypertension discussed Benefits of exercise discussed Recommend to start low-dose losartan 25 mg daily and continue monitoring blood pressure readings at home daily for the next several weeks and at work as well.  Advised to contact the office if numbers persistently abnormal. Stress management and need to identify toxic triggers discussed Known triggers. Blood work done today

## 2023-04-17 NOTE — Patient Instructions (Signed)
Health Maintenance, Male Adopting a healthy lifestyle and getting preventive care are important in promoting health and wellness. Ask your health care provider about: The right schedule for you to have regular tests and exams. Things you can do on your own to prevent diseases and keep yourself healthy. What should I know about diet, weight, and exercise? Eat a healthy diet  Eat a diet that includes plenty of vegetables, fruits, low-fat dairy products, and lean protein. Do not eat a lot of foods that are high in solid fats, added sugars, or sodium. Maintain a healthy weight Body mass index (BMI) is a measurement that can be used to identify possible weight problems. It estimates body fat based on height and weight. Your health care provider can help determine your BMI and help you achieve or maintain a healthy weight. Get regular exercise Get regular exercise. This is one of the most important things you can do for your health. Most adults should: Exercise for at least 150 minutes each week. The exercise should increase your heart rate and make you sweat (moderate-intensity exercise). Do strengthening exercises at least twice a week. This is in addition to the moderate-intensity exercise. Spend less time sitting. Even light physical activity can be beneficial. Watch cholesterol and blood lipids Have your blood tested for lipids and cholesterol at 32 years of age, then have this test every 5 years. You may need to have your cholesterol levels checked more often if: Your lipid or cholesterol levels are high. You are older than 32 years of age. You are at high risk for heart disease. What should I know about cancer screening? Many types of cancers can be detected early and may often be prevented. Depending on your health history and family history, you may need to have cancer screening at various ages. This may include screening for: Colorectal cancer. Prostate cancer. Skin cancer. Lung  cancer. What should I know about heart disease, diabetes, and high blood pressure? Blood pressure and heart disease High blood pressure causes heart disease and increases the risk of stroke. This is more likely to develop in people who have high blood pressure readings or are overweight. Talk with your health care provider about your target blood pressure readings. Have your blood pressure checked: Every 3-5 years if you are 18-39 years of age. Every year if you are 40 years old or older. If you are between the ages of 65 and 75 and are a current or former smoker, ask your health care provider if you should have a one-time screening for abdominal aortic aneurysm (AAA). Diabetes Have regular diabetes screenings. This checks your fasting blood sugar level. Have the screening done: Once every three years after age 45 if you are at a normal weight and have a low risk for diabetes. More often and at a younger age if you are overweight or have a high risk for diabetes. What should I know about preventing infection? Hepatitis B If you have a higher risk for hepatitis B, you should be screened for this virus. Talk with your health care provider to find out if you are at risk for hepatitis B infection. Hepatitis C Blood testing is recommended for: Everyone born from 1945 through 1965. Anyone with known risk factors for hepatitis C. Sexually transmitted infections (STIs) You should be screened each year for STIs, including gonorrhea and chlamydia, if: You are sexually active and are younger than 32 years of age. You are older than 32 years of age and your   health care provider tells you that you are at risk for this type of infection. Your sexual activity has changed since you were last screened, and you are at increased risk for chlamydia or gonorrhea. Ask your health care provider if you are at risk. Ask your health care provider about whether you are at high risk for HIV. Your health care provider  may recommend a prescription medicine to help prevent HIV infection. If you choose to take medicine to prevent HIV, you should first get tested for HIV. You should then be tested every 3 months for as long as you are taking the medicine. Follow these instructions at home: Alcohol use Do not drink alcohol if your health care provider tells you not to drink. If you drink alcohol: Limit how much you have to 0-2 drinks a day. Know how much alcohol is in your drink. In the [place] one drink equals one 12 oz bottle of beer (355 mL), one 5 oz glass of wine (148 mL), or one 1 oz glass of hard liquor (44 mL). Lifestyle Do not use any products that contain nicotine or tobacco. These products include cigarettes, chewing tobacco, and vaping devices, such as e-cigarettes. If you need help quitting, ask your health care provider. Do not use street drugs. Do not share needles. Ask your health care provider for help if you need support or information about quitting drugs. General instructions Schedule regular health, dental, and eye exams. Stay current with your vaccines. Tell your health care provider if: You often feel depressed. You have ever been abused or do not feel safe at home. Summary Adopting a healthy lifestyle and getting preventive care are important in promoting health and wellness. Follow your health care provider's instructions about healthy diet, exercising, and getting tested or screened for diseases. Follow your health care provider's instructions on monitoring your cholesterol and blood pressure. This information is not intended to replace advice given to you by your health care provider. Make sure you discuss any questions you have with your health care provider. Document Revised: 01/22/2021 Document Reviewed: 01/22/2021 Elsevier Patient Education  2024 [employer]  

## 2023-04-17 NOTE — Progress Notes (Signed)
Jeremy Blanchard 31 y.o.   Chief Complaint  Patient presents with   Annual Exam    HISTORY OF PRESENT ILLNESS: This is a 32 y.o. male here for annual exam Concerned about blood pressure.  Elevated readings at work, normal at home.  150-160/100 at work No other complaints or medical concerns today.  HPI   Prior to Admission medications   Medication Sig Start Date End Date Taking? Authorizing Provider  cetirizine (ZYRTEC) 10 MG tablet Take 10 mg by mouth daily.   Yes [provider]  naproxen sodium (ALEVE) 220 MG tablet Take 220 mg by mouth as needed.   Yes [provider]    No Known Allergies  There are no problems to display for this patient.   Past Medical History:  Diagnosis Date   Allergy    [employer] 07/09/2020   Hyperlipidemia    [employer] 07/09/2020   Seasonal allergies     Past Surgical History:  Procedure Laterality Date   BRAIN SURGERY      Social History   Socioeconomic History   Marital status: Single    Spouse name: Not on file   Number of children: Not on file   Years of education: Not on file   Highest education level: Not on file  Occupational History   Occupation: EMT  Tobacco Use   Smoking status: Never   Smokeless tobacco: Never  Substance and Sexual Activity   Alcohol use: Yes    Comment: socal drinker   Drug use: Never   Sexual activity: Yes  Other Topics Concern   Not on file  Social History Narrative   Not on file   Social Determinants of Health   Financial Resource Strain: Not on file  Food Insecurity: Not on file  Transportation Needs: Not on file  Physical Activity: Not on file  Stress: Not on file  Social Connections: Not on file  Intimate Partner Violence: Not on file    Family History  Problem Relation Age of Onset   Hypertension Mother    High Cholesterol Father    Hypertension Father    Hypertension Brother    Healthy Brother      Review of Systems  Constitutional: Negative.  Negative  for chills and fever.  HENT: Negative.  Negative for congestion and sore throat.   Respiratory: Negative.  Negative for cough and shortness of breath.   Cardiovascular: Negative.  Negative for chest pain and palpitations.  Gastrointestinal:  Negative for abdominal pain, nausea and vomiting.  Genitourinary: Negative.  Negative for dysuria and hematuria.  Skin: Negative.  Negative for rash.  Neurological: Negative.  Negative for dizziness and headaches.  All other systems reviewed and are negative.   Vitals:   04/17/23 0935  BP: 132/88  Pulse: 69  Temp: 98.1 F (36.7 C)  SpO2: 98%    Physical Exam Vitals reviewed.  Constitutional:      Appearance: Normal appearance.  HENT:     Head: Normocephalic.     Right Ear: Tympanic membrane, ear canal and external ear normal.     Left Ear: Tympanic membrane, ear canal and external ear normal.     Mouth/Throat:     Mouth: Mucous membranes are moist.     Pharynx: Oropharynx is clear.  Eyes:     Extraocular Movements: Extraocular movements intact.     Conjunctiva/sclera: Conjunctivae normal.     Pupils: Pupils are equal, round, and reactive to light.  Cardiovascular:     Rate  and Rhythm: Normal rate and regular rhythm.     Pulses: Normal pulses.     Heart sounds: Normal heart sounds.  Pulmonary:     Effort: Pulmonary effort is normal.     Breath sounds: Normal breath sounds.  Abdominal:     Palpations: Abdomen is soft.     Tenderness: There is no abdominal tenderness.  Musculoskeletal:     Cervical back: No tenderness.  Lymphadenopathy:     Cervical: No cervical adenopathy.  Skin:    General: Skin is warm and dry.     Capillary Refill: Capillary refill takes less than 2 seconds.  Neurological:     General: No focal deficit present.     Mental Status: He is alert and oriented to person, place, and time.  Psychiatric:        Mood and Affect: Mood normal.        Behavior: Behavior normal.      ASSESSMENT & PLAN: Problem  List Items Addressed This Visit       Cardiovascular and Mediastinum   Essential hypertension    Elevated blood pressure readings in the office and at work Cardiovascular risks associated with uncontrolled hypertension discussed Dietary approaches to stop hypertension discussed Benefits of exercise discussed Recommend to start low-dose losartan 25 mg daily and continue monitoring blood pressure readings at home daily for the next several weeks and at work as well.  Advised to contact the office if numbers persistently abnormal. Stress management and need to identify toxic triggers discussed Known triggers. Blood work done today      Relevant Medications   losartan (COZAAR) 25 MG tablet   Other Relevant Orders   CBC with Differential   Comprehensive metabolic panel   Hemoglobin A1c   Lipid panel   Other Visit Diagnoses     Encounter for general adult medical examination with abnormal findings    -  Primary   Relevant Orders   CBC with Differential   Comprehensive metabolic panel   Hemoglobin A1c   Lipid panel   Screening for deficiency anemia       Relevant Orders   CBC with Differential   Screening for lipoid disorders       Relevant Orders   Lipid panel   Screening for endocrine, metabolic and immunity disorder       Relevant Orders   Comprehensive metabolic panel   Hemoglobin A1c      Modifiable risk factors discussed with patient. Anticipatory guidance according to age provided. The following topics were also discussed: Social Determinants of Health Smoking.  Non-smoker Diet and nutrition and need to decrease amount of daily carbohydrate intake and daily calories and increase amount of plant based protein in his diet Benefits of exercise Cancer family history review Vaccinations review and recommendations Cardiovascular risk assessment and need for blood work Mental health including depression and anxiety Fall and accident prevention  Patient Instructions   Health Maintenance, Male Adopting a healthy lifestyle and getting preventive care are important in promoting health and wellness. Ask your health care provider about: The right schedule for you to have regular tests and exams. Things you can do on your own to prevent diseases and keep yourself healthy. What should I know about diet, weight, and exercise? Eat a healthy diet  Eat a diet that includes plenty of vegetables, fruits, low-fat dairy products, and lean protein. Do not eat a lot of foods that are high in solid fats, added sugars, or sodium. Maintain a  healthy weight Body mass index (BMI) is a measurement that can be used to identify possible weight problems. It estimates body fat based on height and weight. Your health care provider can help determine your BMI and help you achieve or maintain a healthy weight. Get regular exercise Get regular exercise. This is one of the most important things you can do for your health. Most adults should: Exercise for at least 150 minutes each week. The exercise should increase your heart rate and make you sweat (moderate-intensity exercise). Do strengthening exercises at least twice a week. This is in addition to the moderate-intensity exercise. Spend less time sitting. Even light physical activity can be beneficial. Watch cholesterol and blood lipids Have your blood tested for lipids and cholesterol at 32 years of age, then have this test every 5 years. You may need to have your cholesterol levels checked more often if: Your lipid or cholesterol levels are high. You are older than 32 years of age. You are at high risk for heart disease. What should I know about cancer screening? Many types of cancers can be detected early and may often be prevented. Depending on your health history and family history, you may need to have cancer screening at various ages. This may include screening for: Colorectal cancer. Prostate cancer. Skin cancer. Lung  cancer. What should I know about heart disease, diabetes, and high blood pressure? Blood pressure and heart disease High blood pressure causes heart disease and increases the risk of stroke. This is more likely to develop in people who have high blood pressure readings or are overweight. Talk with your health care provider about your target blood pressure readings. Have your blood pressure checked: Every 3-5 years if you are 29-85 years of age. Every year if you are 45 years old or older. If you are between the ages of 31 and 67 and are a current or former smoker, ask your health care provider if you should have a one-time screening for abdominal aortic aneurysm (AAA). Diabetes Have regular diabetes screenings. This checks your fasting blood sugar level. Have the screening done: Once every three years after age 51 if you are at a normal weight and have a low risk for diabetes. More often and at a younger age if you are overweight or have a high risk for diabetes. What should I know about preventing infection? Hepatitis B If you have a higher risk for hepatitis B, you should be screened for this virus. Talk with your health care provider to find out if you are at risk for hepatitis B infection. Hepatitis C Blood testing is recommended for: Everyone born from [birth year] through 1965. Anyone with known risk factors for hepatitis C. Sexually transmitted infections (STIs) You should be screened each year for STIs, including gonorrhea and chlamydia, if: You are sexually active and are younger than 32 years of age. You are older than 32 years of age and your health care provider tells you that you are at risk for this type of infection. Your sexual activity has changed since you were last screened, and you are at increased risk for chlamydia or gonorrhea. Ask your health care provider if you are at risk. Ask your health care provider about whether you are at high risk for HIV. Your health care provider  may recommend a prescription medicine to help prevent HIV infection. If you choose to take medicine to prevent HIV, you should first get tested for HIV. You should then be tested every 3  months for as long as you are taking the medicine. Follow these instructions at home: Alcohol use Do not drink alcohol if your health care provider tells you not to drink. If you drink alcohol: Limit how much you have to 0-2 drinks a day. Know how much alcohol is in your drink. In the [place] one drink equals one 12 oz bottle of beer (355 mL), one 5 oz glass of wine (148 mL), or one 1 oz glass of hard liquor (44 mL). Lifestyle Do not use any products that contain nicotine or tobacco. These products include cigarettes, chewing tobacco, and vaping devices, such as e-cigarettes. If you need help quitting, ask your health care provider. Do not use street drugs. Do not share needles. Ask your health care provider for help if you need support or information about quitting drugs. General instructions Schedule regular health, dental, and eye exams. Stay current with your vaccines. Tell your health care provider if: You often feel depressed. You have ever been abused or do not feel safe at home. Summary Adopting a healthy lifestyle and getting preventive care are important in promoting health and wellness. Follow your health care provider's instructions about healthy diet, exercising, and getting tested or screened for diseases. Follow your health care provider's instructions on monitoring your cholesterol and blood pressure. This information is not intended to replace advice given to you by your health care provider. Make sure you discuss any questions you have with your health care provider. Document Revised: 01/22/2021 Document Reviewed: 01/22/2021 Elsevier Patient Education  2024 [employer]     Edwina Barth, MD [facility]

## 2023-05-14 ENCOUNTER — Other Ambulatory Visit: Payer: Self-pay

## 2023-05-14 ENCOUNTER — Other Ambulatory Visit (HOSPITAL_BASED_OUTPATIENT_CLINIC_OR_DEPARTMENT_OTHER): Payer: Self-pay

## 2023-05-14 MED ORDER — LOSARTAN POTASSIUM 25 MG PO TABS
25.0000 mg | ORAL_TABLET | Freq: Every day | ORAL | 3 refills | Status: DC
  Filled 2023-05-14: qty 90, 90d supply, fill #0

## 2023-05-23 ENCOUNTER — Other Ambulatory Visit (HOSPITAL_BASED_OUTPATIENT_CLINIC_OR_DEPARTMENT_OTHER): Payer: Self-pay

## 2023-06-05 ENCOUNTER — Other Ambulatory Visit (HOSPITAL_BASED_OUTPATIENT_CLINIC_OR_DEPARTMENT_OTHER): Payer: Self-pay

## 2023-06-05 MED ORDER — INFLUENZA VIRUS VACC SPLIT PF (FLUZONE) 0.5 ML IM SUSY
0.5000 mL | PREFILLED_SYRINGE | Freq: Once | INTRAMUSCULAR | 0 refills | Status: AC
  Filled 2023-06-05: qty 0.5, 1d supply, fill #0

## 2023-06-30 ENCOUNTER — Ambulatory Visit: Payer: 59 | Admitting: Emergency Medicine

## 2023-07-01 ENCOUNTER — Other Ambulatory Visit (HOSPITAL_BASED_OUTPATIENT_CLINIC_OR_DEPARTMENT_OTHER): Payer: Self-pay

## 2023-07-08 ENCOUNTER — Encounter: Payer: Self-pay | Admitting: Emergency Medicine

## 2023-07-08 ENCOUNTER — Ambulatory Visit: Payer: 59 | Admitting: Emergency Medicine

## 2023-07-08 ENCOUNTER — Other Ambulatory Visit (HOSPITAL_BASED_OUTPATIENT_CLINIC_OR_DEPARTMENT_OTHER): Payer: Self-pay

## 2023-07-08 VITALS — BP 140/92 | HR 85 | Temp 97.8°F | Ht 69.0 in | Wt 198.0 lb

## 2023-07-08 DIAGNOSIS — I10 Essential (primary) hypertension: Secondary | ICD-10-CM

## 2023-07-08 MED ORDER — LOSARTAN POTASSIUM 50 MG PO TABS
50.0000 mg | ORAL_TABLET | Freq: Every day | ORAL | 3 refills | Status: DC
Start: 2023-07-08 — End: 2023-10-09
  Filled 2023-07-08 (×2): qty 90, 90d supply, fill #0
  Filled 2023-09-25: qty 90, 90d supply, fill #1

## 2023-07-08 NOTE — Progress Notes (Signed)
Jeremy Blanchard 32 y.o.   Chief Complaint  Patient presents with   Hypertension    Would like to discuss  increase in BP meds, BP has been running high recently    HISTORY OF PRESENT ILLNESS: This is a 32 y.o. male A1A here for follow-up of hypertension 1 month ago started taking losartan 25 mg.  Blood pressure readings at home remain elevated Asymptomatic. No other complaints or medical concerns today.  Hypertension Pertinent negatives include no chest pain, headaches, palpitations or shortness of breath.     Prior to Admission medications   Medication Sig Start Date End Date Taking? Authorizing Provider  cetirizine (ZYRTEC) 10 MG tablet Take 10 mg by mouth daily.   Yes [provider]  losartan (COZAAR) 25 MG tablet Take 1 tablet (25 mg total) by mouth daily. 04/17/23  Yes Jayelyn Barno, Eilleen Kempf, MD  losartan (COZAAR) 25 MG tablet Take 1 tablet (25 mg total) by mouth daily. 04/17/23  Yes Kynedi Profitt, Eilleen Kempf, MD  naproxen sodium (ALEVE) 220 MG tablet Take 220 mg by mouth as needed.   Yes [provider]    No Known Allergies  Patient Active Problem List   Diagnosis Date Noted   Essential hypertension 04/17/2023    Past Medical History:  Diagnosis Date   Allergy    [employer] 07/09/2020   Hyperlipidemia    [employer] 07/09/2020   Seasonal allergies     Past Surgical History:  Procedure Laterality Date   BRAIN SURGERY      Social History   Socioeconomic History   Marital status: Married    Spouse name: Not on file   Number of children: Not on file   Years of education: Not on file   Highest education level: Bachelor's degree (e.g., BA, AB, BS)  Occupational History   Occupation: EMT  Tobacco Use   Smoking status: Never   Smokeless tobacco: Never  Substance and Sexual Activity   Alcohol use: Yes    Comment: socal drinker   Drug use: Never   Sexual activity: Yes  Other Topics Concern   Not on file  Social History Narrative   Not on file    Social Determinants of Health   Financial Resource Strain: Low Risk  (07/07/2023)   Overall Financial Resource Strain (CARDIA)    Difficulty of Paying Living Expenses: Not hard at all  Food Insecurity: No Food Insecurity (07/07/2023)   Hunger Vital Sign    Worried About Running Out of Food in the Last Year: Never true    Ran Out of Food in the Last Year: Never true  Transportation Needs: No Transportation Needs (07/07/2023)   PRAPARE - Administrator, Civil Service (Medical): No    Lack of Transportation (Non-Medical): No  Physical Activity: Insufficiently Active (07/07/2023)   Exercise Vital Sign    Days of Exercise per Week: 4 days    Minutes of Exercise per Session: 30 min  Stress: Stress Concern Present (07/07/2023)   [employer] of Occupational Health - Occupational Stress Questionnaire    Feeling of Stress : Rather much  Social Connections: Moderately Integrated (07/07/2023)   Social Connection and Isolation Panel [NHANES]    Frequency of Communication with Friends and Family: More than three times a week    Frequency of Social Gatherings with Friends and Family: Once a week    Attends Religious Services: Never    Database administrator or Organizations: Yes    Attends Banker  Meetings: More than 4 times per year    Marital Status: Married  Catering manager Violence: Not on file    Family History  Problem Relation Age of Onset   Hypertension Mother    High Cholesterol Father    Hypertension Father    Hypertension Brother    Healthy Brother      Review of Systems  Constitutional: Negative.  Negative for chills and fever.  HENT: Negative.  Negative for congestion and sore throat.   Respiratory: Negative.  Negative for cough and shortness of breath.   Cardiovascular: Negative.  Negative for chest pain and palpitations.  Gastrointestinal:  Negative for abdominal pain, diarrhea, nausea and vomiting.  Genitourinary: Negative.  Negative  for dysuria and hematuria.  Skin: Negative.  Negative for rash.  Neurological: Negative.  Negative for dizziness and headaches.  All other systems reviewed and are negative.   Vitals:   07/08/23 1427  BP: (!) 140/92  Pulse: 85  Temp: 97.8 F (36.6 C)  SpO2: 98%    Physical Exam Vitals reviewed.  Constitutional:      Appearance: Normal appearance.  HENT:     Head: Normocephalic.  Eyes:     Extraocular Movements: Extraocular movements intact.  Cardiovascular:     Rate and Rhythm: Normal rate and regular rhythm.     Pulses: Normal pulses.     Heart sounds: Normal heart sounds.  Pulmonary:     Effort: Pulmonary effort is normal.     Breath sounds: Normal breath sounds.  Skin:    General: Skin is warm and dry.     Capillary Refill: Capillary refill takes less than 2 seconds.  Neurological:     General: No focal deficit present.     Mental Status: He is alert and oriented to person, place, and time.  Psychiatric:        Mood and Affect: Mood normal.        Behavior: Behavior normal.      ASSESSMENT & PLAN: A total of 42 minutes was spent with the patient and counseling/coordination of care regarding preparing for this visit, review of most recent office visit notes, review of most recent blood work results, diagnosis of hypertension and cardiovascular risks associated with this condition, review of all medications and changes made, education on nutrition, prognosis, documentation and need for follow-up.  Problem List Items Addressed This Visit       Cardiovascular and Mediastinum   Essential hypertension - Primary    Blood pressure reading still elevated. Recommend to increase losartan to 50 mg daily Advised to continue monitoring blood pressure readings at home daily for the next several weeks and keep a log.  Advised to increase dose of losartan if numbers persistently abnormal. Cardiovascular risks associated with hypertension discussed. Dietary approaches to stop  hypertension discussed. Blood results from last August reviewed with patient.  Unremarkable results. Follow-up in 3 months.      Relevant Medications   losartan (COZAAR) 50 MG tablet   Patient Instructions  Hypertension, Adult High blood pressure (hypertension) is when the force of blood pumping through the arteries is too strong. The arteries are the blood vessels that carry blood from the heart throughout the body. Hypertension forces the heart to work harder to pump blood and may cause arteries to become narrow or stiff. Untreated or uncontrolled hypertension can lead to a heart attack, heart failure, a stroke, kidney disease, and other problems. A blood pressure reading consists of a higher number over a  lower number. Ideally, your blood pressure should be below 120/80. The first ("top") number is called the systolic pressure. It is a measure of the pressure in your arteries as your heart beats. The second ("bottom") number is called the diastolic pressure. It is a measure of the pressure in your arteries as the heart relaxes. What are the causes? The exact cause of this condition is not known. There are some conditions that result in high blood pressure. What increases the risk? Certain factors may make you more likely to develop high blood pressure. Some of these risk factors are under your control, including: Smoking. Not getting enough exercise or physical activity. Being overweight. Having too much fat, sugar, calories, or salt (sodium) in your diet. Drinking too much alcohol. Other risk factors include: Having a personal history of heart disease, diabetes, high cholesterol, or kidney disease. Stress. Having a family history of high blood pressure and high cholesterol. Having obstructive sleep apnea. Age. The risk increases with age. What are the signs or symptoms? High blood pressure may not cause symptoms. Very high blood pressure (hypertensive crisis) may  cause: Headache. Fast or irregular heartbeats (palpitations). Shortness of breath. Nosebleed. Nausea and vomiting. Vision changes. Severe chest pain, dizziness, and seizures. How is this diagnosed? This condition is diagnosed by measuring your blood pressure while you are seated, with your arm resting on a flat surface, your legs uncrossed, and your feet flat on the floor. The cuff of the blood pressure monitor will be placed directly against the skin of your upper arm at the level of your heart. Blood pressure should be measured at least twice using the same arm. Certain conditions can cause a difference in blood pressure between your right and left arms. If you have a high blood pressure reading during one visit or you have normal blood pressure with other risk factors, you may be asked to: Return on a different day to have your blood pressure checked again. Monitor your blood pressure at home for 1 week or longer. If you are diagnosed with hypertension, you may have other blood or imaging tests to help your health care provider understand your overall risk for other conditions. How is this treated? This condition is treated by making healthy lifestyle changes, such as eating healthy foods, exercising more, and reducing your alcohol intake. You may be referred for counseling on a healthy diet and physical activity. Your health care provider may prescribe medicine if lifestyle changes are not enough to get your blood pressure under control and if: Your systolic blood pressure is above 130. Your diastolic blood pressure is above 80. Your personal target blood pressure may vary depending on your medical conditions, your age, and other factors. Follow these instructions at home: Eating and drinking  Eat a diet that is high in fiber and potassium, and low in sodium, added sugar, and fat. An example of this eating plan is called the DASH diet. DASH stands for Dietary Approaches to Stop  Hypertension. To eat this way: Eat plenty of fresh fruits and vegetables. Try to fill one half of your plate at each meal with fruits and vegetables. Eat whole grains, such as whole-wheat pasta, brown rice, or whole-grain bread. Fill about one fourth of your plate with whole grains. Eat or drink low-fat dairy products, such as skim milk or low-fat yogurt. Avoid fatty cuts of meat, processed or cured meats, and poultry with skin. Fill about one fourth of your plate with lean proteins, such as fish,  chicken without skin, beans, eggs, or tofu. Avoid pre-made and processed foods. These tend to be higher in sodium, added sugar, and fat. Reduce your daily sodium intake. Many people with hypertension should eat less than 1,500 mg of sodium a day. Do not drink alcohol if: Your health care provider tells you not to drink. You are pregnant, may be pregnant, or are planning to become pregnant. If you drink alcohol: Limit how much you have to: 0-1 drink a day for women. 0-2 drinks a day for men. Know how much alcohol is in your drink. In the [place] one drink equals one 12 oz bottle of beer (355 mL), one 5 oz glass of wine (148 mL), or one 1 oz glass of hard liquor (44 mL). Lifestyle  Work with your health care provider to maintain a healthy body weight or to lose weight. Ask what an ideal weight is for you. Get at least 30 minutes of exercise that causes your heart to beat faster (aerobic exercise) most days of the week. Activities may include walking, swimming, or biking. Include exercise to strengthen your muscles (resistance exercise), such as Pilates or lifting weights, as part of your weekly exercise routine. Try to do these types of exercises for 30 minutes at least 3 days a week. Do not use any products that contain nicotine or tobacco. These products include cigarettes, chewing tobacco, and vaping devices, such as e-cigarettes. If you need help quitting, ask your health care provider. Monitor your  blood pressure at home as told by your health care provider. Keep all follow-up visits. This is important. Medicines Take over-the-counter and prescription medicines only as told by your health care provider. Follow directions carefully. Blood pressure medicines must be taken as prescribed. Do not skip doses of blood pressure medicine. Doing this puts you at risk for problems and can make the medicine less effective. Ask your health care provider about side effects or reactions to medicines that you should watch for. Contact a health care provider if you: Think you are having a reaction to a medicine you are taking. Have headaches that keep coming back (recurring). Feel dizzy. Have swelling in your ankles. Have trouble with your vision. Get help right away if you: Develop a severe headache or confusion. Have unusual weakness or numbness. Feel faint. Have severe pain in your chest or abdomen. Vomit repeatedly. Have trouble breathing. These symptoms may be an emergency. Get help right away. Call 911. Do not wait to see if the symptoms will go away. Do not drive yourself to the hospital. Summary Hypertension is when the force of blood pumping through your arteries is too strong. If this condition is not controlled, it may put you at risk for serious complications. Your personal target blood pressure may vary depending on your medical conditions, your age, and other factors. For most people, a normal blood pressure is less than 120/80. Hypertension is treated with lifestyle changes, medicines, or a combination of both. Lifestyle changes include losing weight, eating a healthy, low-sodium diet, exercising more, and limiting alcohol. This information is not intended to replace advice given to you by your health care provider. Make sure you discuss any questions you have with your health care provider. Document Revised: 07/10/2021 Document Reviewed: 07/10/2021 Elsevier Patient Education  2024  Elsevier Inc.     Edwina Barth, MD [facility]

## 2023-07-08 NOTE — Assessment & Plan Note (Signed)
Blood pressure reading still elevated. Recommend to increase losartan to 50 mg daily Advised to continue monitoring blood pressure readings at home daily for the next several weeks and keep a log.  Advised to increase dose of losartan if numbers persistently abnormal. Cardiovascular risks associated with hypertension discussed. Dietary approaches to stop hypertension discussed. Blood results from last August reviewed with patient.  Unremarkable results. Follow-up in 3 months.

## 2023-07-08 NOTE — Patient Instructions (Signed)
Hypertension, Adult High blood pressure (hypertension) is when the force of blood pumping through the arteries is too strong. The arteries are the blood vessels that carry blood from the heart throughout the body. Hypertension forces the heart to work harder to pump blood and may cause arteries to become narrow or stiff. Untreated or uncontrolled hypertension can lead to a heart attack, heart failure, a stroke, kidney disease, and other problems. A blood pressure reading consists of a higher number over a lower number. Ideally, your blood pressure should be below 120/80. The first ("top") number is called the systolic pressure. It is a measure of the pressure in your arteries as your heart beats. The second ("bottom") number is called the diastolic pressure. It is a measure of the pressure in your arteries as the heart relaxes. What are the causes? The exact cause of this condition is not known. There are some conditions that result in high blood pressure. What increases the risk? Certain factors may make you more likely to develop high blood pressure. Some of these risk factors are under your control, including: Smoking. Not getting enough exercise or physical activity. Being overweight. Having too much fat, sugar, calories, or salt (sodium) in your diet. Drinking too much alcohol. Other risk factors include: Having a personal history of heart disease, diabetes, high cholesterol, or kidney disease. Stress. Having a family history of high blood pressure and high cholesterol. Having obstructive sleep apnea. Age. The risk increases with age. What are the signs or symptoms? High blood pressure may not cause symptoms. Very high blood pressure (hypertensive crisis) may cause: Headache. Fast or irregular heartbeats (palpitations). Shortness of breath. Nosebleed. Nausea and vomiting. Vision changes. Severe chest pain, dizziness, and seizures. How is this diagnosed? This condition is diagnosed by  measuring your blood pressure while you are seated, with your arm resting on a flat surface, your legs uncrossed, and your feet flat on the floor. The cuff of the blood pressure monitor will be placed directly against the skin of your upper arm at the level of your heart. Blood pressure should be measured at least twice using the same arm. Certain conditions can cause a difference in blood pressure between your right and left arms. If you have a high blood pressure reading during one visit or you have normal blood pressure with other risk factors, you may be asked to: Return on a different day to have your blood pressure checked again. Monitor your blood pressure at home for 1 week or longer. If you are diagnosed with hypertension, you may have other blood or imaging tests to help your health care provider understand your overall risk for other conditions. How is this treated? This condition is treated by making healthy lifestyle changes, such as eating healthy foods, exercising more, and reducing your alcohol intake. You may be referred for counseling on a healthy diet and physical activity. Your health care provider may prescribe medicine if lifestyle changes are not enough to get your blood pressure under control and if: Your systolic blood pressure is above 130. Your diastolic blood pressure is above 80. Your personal target blood pressure may vary depending on your medical conditions, your age, and other factors. Follow these instructions at home: Eating and drinking  Eat a diet that is high in fiber and potassium, and low in sodium, added sugar, and fat. An example of this eating plan is called the DASH diet. DASH stands for Dietary Approaches to Stop Hypertension. To eat this way: Eat   plenty of fresh fruits and vegetables. Try to fill one half of your plate at each meal with fruits and vegetables. Eat whole grains, such as whole-wheat pasta, brown rice, or whole-grain bread. Fill about one  fourth of your plate with whole grains. Eat or drink low-fat dairy products, such as skim milk or low-fat yogurt. Avoid fatty cuts of meat, processed or cured meats, and poultry with skin. Fill about one fourth of your plate with lean proteins, such as fish, chicken without skin, beans, eggs, or tofu. Avoid pre-made and processed foods. These tend to be higher in sodium, added sugar, and fat. Reduce your daily sodium intake. Many people with hypertension should eat less than 1,500 mg of sodium a day. Do not drink alcohol if: Your health care provider tells you not to drink. You are pregnant, may be pregnant, or are planning to become pregnant. If you drink alcohol: Limit how much you have to: 0-1 drink a day for women. 0-2 drinks a day for men. Know how much alcohol is in your drink. In the [place] one drink equals one 12 oz bottle of beer (355 mL), one 5 oz glass of wine (148 mL), or one 1 oz glass of hard liquor (44 mL). Lifestyle  Work with your health care provider to maintain a healthy body weight or to lose weight. Ask what an ideal weight is for you. Get at least 30 minutes of exercise that causes your heart to beat faster (aerobic exercise) most days of the week. Activities may include walking, swimming, or biking. Include exercise to strengthen your muscles (resistance exercise), such as Pilates or lifting weights, as part of your weekly exercise routine. Try to do these types of exercises for 30 minutes at least 3 days a week. Do not use any products that contain nicotine or tobacco. These products include cigarettes, chewing tobacco, and vaping devices, such as e-cigarettes. If you need help quitting, ask your health care provider. Monitor your blood pressure at home as told by your health care provider. Keep all follow-up visits. This is important. Medicines Take over-the-counter and prescription medicines only as told by your health care provider. Follow directions carefully. Blood  pressure medicines must be taken as prescribed. Do not skip doses of blood pressure medicine. Doing this puts you at risk for problems and can make the medicine less effective. Ask your health care provider about side effects or reactions to medicines that you should watch for. Contact a health care provider if you: Think you are having a reaction to a medicine you are taking. Have headaches that keep coming back (recurring). Feel dizzy. Have swelling in your ankles. Have trouble with your vision. Get help right away if you: Develop a severe headache or confusion. Have unusual weakness or numbness. Feel faint. Have severe pain in your chest or abdomen. Vomit repeatedly. Have trouble breathing. These symptoms may be an emergency. Get help right away. Call 911. Do not wait to see if the symptoms will go away. Do not drive yourself to the hospital. Summary Hypertension is when the force of blood pumping through your arteries is too strong. If this condition is not controlled, it may put you at risk for serious complications. Your personal target blood pressure may vary depending on your medical conditions, your age, and other factors. For most people, a normal blood pressure is less than 120/80. Hypertension is treated with lifestyle changes, medicines, or a combination of both. Lifestyle changes include losing weight, eating a healthy,   low-sodium diet, exercising more, and limiting alcohol. This information is not intended to replace advice given to you by your health care provider. Make sure you discuss any questions you have with your health care provider. Document Revised: 07/10/2021 Document Reviewed: 07/10/2021 Elsevier Patient Education  2024 Elsevier Inc.  

## 2023-07-12 ENCOUNTER — Ambulatory Visit
Admission: EM | Admit: 2023-07-12 | Discharge: 2023-07-12 | Disposition: A | Discharge status: Home / Self Care | Payer: 59 | Attending: Internal Medicine | Admitting: Internal Medicine

## 2023-07-12 ENCOUNTER — Other Ambulatory Visit (HOSPITAL_BASED_OUTPATIENT_CLINIC_OR_DEPARTMENT_OTHER): Payer: Self-pay

## 2023-07-12 DIAGNOSIS — B349 Viral infection, unspecified: Secondary | ICD-10-CM | POA: Diagnosis not present

## 2023-07-12 DIAGNOSIS — Z1152 Encounter for screening for COVID-19: Secondary | ICD-10-CM | POA: Diagnosis not present

## 2023-07-12 DIAGNOSIS — J40 Bronchitis, not specified as acute or chronic: Secondary | ICD-10-CM | POA: Diagnosis not present

## 2023-07-12 DIAGNOSIS — J309 Allergic rhinitis, unspecified: Secondary | ICD-10-CM | POA: Insufficient documentation

## 2023-07-12 MED ORDER — PROMETHAZINE-DM 6.25-15 MG/5ML PO SYRP
5.0000 mL | ORAL_SOLUTION | Freq: Three times a day (TID) | ORAL | 0 refills | Status: DC | PRN
  Filled 2023-07-12: qty 200, 14d supply, fill #0

## 2023-07-12 MED ORDER — PREDNISONE 20 MG PO TABS
ORAL_TABLET | ORAL | 0 refills | Status: DC
  Filled 2023-07-12: qty 10, 5d supply, fill #0

## 2023-07-12 NOTE — ED Provider Notes (Signed)
[facility]  Note:  This document was prepared using Conservation officer, historic buildings and may include unintentional dictation errors.  MRN: [id number] DOB: July 15, 1991  Subjective:   Jeremy Blanchard is a 32 y.o. male presenting for 2 to 3-day history of acute onset fever, body pains, sinus and chest congestion.  Reports that he felt chest discomfort initially.  Normally when he gets sick, has a lot of sinus symptoms and then gets bronchitis type symptoms.  Usually responds well with prednisone.  Has allergic rhinitis and takes Zyrtec consistently for it.  Would like to get a COVID test.  No smoking of any kind.  No history of asthma.  No current facility-administered medications for this encounter.  Current Outpatient Medications:    cetirizine (ZYRTEC) 10 MG tablet, Take 10 mg by mouth daily., Disp: , Rfl:    losartan (COZAAR) 50 MG tablet, Take 1 tablet (50 mg total) by mouth daily., Disp: 90 tablet, Rfl: 3   naproxen sodium (ALEVE) 220 MG tablet, Take 220 mg by mouth as needed., Disp: , Rfl:    No Known Allergies  Past Medical History:  Diagnosis Date   Allergy    Phreesia 07/09/2020   Hyperlipidemia    Phreesia 07/09/2020   Seasonal allergies      Past Surgical History:  Procedure Laterality Date   BRAIN SURGERY      Family History  Problem Relation Age of Onset   Hypertension Mother    High Cholesterol Father    Hypertension Father    Hypertension Brother    Healthy Brother     Social History   Tobacco Use   Smoking status: Never   Smokeless tobacco: Never  Vaping Use   Vaping status: Never Used  Substance Use Topics   Alcohol use: Yes    Comment: socal drinker   Drug use: Never    ROS   Objective:   Vitals: BP (!) 143/83 (BP Location: Left Arm)   Pulse 76   Temp 99 F (37.2 C) (Oral)   Resp 16   SpO2 96%   Physical Exam Constitutional:      General: He is not in acute distress.    Appearance: Normal appearance. He  is well-developed. He is not ill-appearing, toxic-appearing or diaphoretic.  HENT:     Head: Normocephalic and atraumatic.     Right Ear: External ear normal.     Left Ear: External ear normal.     Nose: Nose normal.     Mouth/Throat:     Mouth: Mucous membranes are moist.  Eyes:     General: No scleral icterus.       Right eye: No discharge.        Left eye: No discharge.     Extraocular Movements: Extraocular movements intact.  Cardiovascular:     Rate and Rhythm: Normal rate and regular rhythm.     Heart sounds: Normal heart sounds. No murmur heard.    No friction rub. No gallop.  Pulmonary:     Effort: Pulmonary effort is normal. No respiratory distress.     Breath sounds: Normal breath sounds. No stridor. No wheezing, rhonchi or rales.  Neurological:     Mental Status: He is alert and oriented to person, place, and time.  Psychiatric:        Mood and Affect: Mood normal.        Behavior: Behavior normal.        Thought Content: Thought content  normal.     Assessment and Plan :   PDMP not reviewed this encounter.  1. Acute viral syndrome   2. Allergic rhinitis, unspecified seasonality, unspecified trigger    Deferred imaging given clear cardiopulmonary exam, hemodynamically stable vital signs.  Patient has typically done well with prednisone and has had bouts of bronchitis, I was agreeable to trialing a prednisone course for him.  Especially in the context of his allergic rhinitis and respiratory symptoms.  Maintain Zyrtec.  COVID testing pending.  Will otherwise recommend supportive care for an acute viral syndrome.  Counseled patient on potential for adverse effects with medications prescribed/recommended today, ER and return-to-clinic precautions discussed, patient verbalized understanding.    Wallis Bamberg, PA-C 07/12/23 1318

## 2023-07-12 NOTE — ED Triage Notes (Addendum)
Pt c/o fever, body aches, head/nasal congestion x 38 hours-last dose ibuprofen ~1 hours PTA-requesting covid test-NAD-steady gait

## 2023-07-12 NOTE — Discharge Instructions (Signed)
We will notify you of your test results as they arrive and may take between about 24 hours.  I encourage you to sign up for MyChart if you have not already done so as this can be the easiest way for us to communicate results to you online or through a phone app.  Generally, we only contact you if it is a positive test result.  In the meantime, if you develop worsening symptoms including fever, chest pain, shortness of breath despite our current treatment plan then please report to the emergency room as this may be a sign of worsening status from possible viral infection.  Otherwise, we will manage this as a viral syndrome. For sore throat or cough try using a honey-based tea. Use 3 teaspoons of honey with juice squeezed from half lemon. Place shaved pieces of ginger into 1/2-1 cup of water and warm over stove top. Then mix the ingredients and repeat every 4 hours as needed. Please take Tylenol 500mg-650mg every 6 hours for aches and pains, fevers. Hydrate very well with at least 2 liters of water. Eat light meals such as soups to replenish electrolytes and soft fruits, veggies. Start an antihistamine like Zyrtec (10mg daily) for postnasal drainage, sinus congestion.  You can take this together with prednisone.  Use the cough medications as needed.   

## 2023-07-13 ENCOUNTER — Ambulatory Visit: Payer: Self-pay

## 2023-07-13 LAB — SARS CORONAVIRUS 2 (TAT 6-24 HRS): SARS Coronavirus 2: NEGATIVE

## 2023-07-23 ENCOUNTER — Other Ambulatory Visit (HOSPITAL_BASED_OUTPATIENT_CLINIC_OR_DEPARTMENT_OTHER): Payer: Self-pay

## 2023-09-25 ENCOUNTER — Other Ambulatory Visit (HOSPITAL_BASED_OUTPATIENT_CLINIC_OR_DEPARTMENT_OTHER): Payer: Self-pay

## 2023-10-08 ENCOUNTER — Ambulatory Visit: Payer: 59 | Admitting: Emergency Medicine

## 2023-10-08 ENCOUNTER — Encounter: Payer: Self-pay | Admitting: Emergency Medicine

## 2023-10-09 ENCOUNTER — Other Ambulatory Visit: Payer: Self-pay

## 2023-10-09 ENCOUNTER — Other Ambulatory Visit: Payer: Self-pay | Admitting: Emergency Medicine

## 2023-10-09 ENCOUNTER — Other Ambulatory Visit (HOSPITAL_BASED_OUTPATIENT_CLINIC_OR_DEPARTMENT_OTHER): Payer: Self-pay

## 2023-10-09 MED ORDER — LOSARTAN POTASSIUM 100 MG PO TABS
100.0000 mg | ORAL_TABLET | Freq: Every day | ORAL | 3 refills | Status: DC
  Filled 2023-10-09 – 2023-11-07 (×2): qty 90, 90d supply, fill #0
  Filled 2024-01-21: qty 90, 90d supply, fill #1
  Filled 2024-03-17: qty 90, 90d supply, fill #2
  Filled 2024-03-23: qty 30, 30d supply, fill #2
  Filled 2024-04-22: qty 30, 30d supply, fill #3
  Filled 2024-05-23: qty 30, 30d supply, fill #4
  Filled 2024-06-23: qty 30, 30d supply, fill #5
  Filled 2024-07-31: qty 60, 60d supply, fill #6

## 2023-10-09 NOTE — Telephone Encounter (Signed)
New prescription for losartan 100 mg tablets sent to pharmacy of record today.  Thanks.

## 2023-10-14 ENCOUNTER — Other Ambulatory Visit (HOSPITAL_BASED_OUTPATIENT_CLINIC_OR_DEPARTMENT_OTHER): Payer: Self-pay

## 2023-10-20 ENCOUNTER — Other Ambulatory Visit (HOSPITAL_BASED_OUTPATIENT_CLINIC_OR_DEPARTMENT_OTHER): Payer: Self-pay

## 2023-11-07 ENCOUNTER — Other Ambulatory Visit (HOSPITAL_BASED_OUTPATIENT_CLINIC_OR_DEPARTMENT_OTHER): Payer: Self-pay

## 2023-12-13 ENCOUNTER — Telehealth: Admitting: Physician Assistant

## 2023-12-13 ENCOUNTER — Other Ambulatory Visit (HOSPITAL_BASED_OUTPATIENT_CLINIC_OR_DEPARTMENT_OTHER): Payer: Self-pay

## 2023-12-13 ENCOUNTER — Other Ambulatory Visit: Payer: Self-pay

## 2023-12-13 DIAGNOSIS — J069 Acute upper respiratory infection, unspecified: Secondary | ICD-10-CM

## 2023-12-13 MED ORDER — FLUTICASONE PROPIONATE 50 MCG/ACT NA SUSP
2.0000 | Freq: Every day | NASAL | 0 refills | Status: DC
  Filled 2023-12-13: qty 16, 30d supply, fill #0

## 2023-12-13 MED ORDER — BENZONATATE 100 MG PO CAPS
ORAL_CAPSULE | ORAL | 0 refills | Status: DC
  Filled 2023-12-13: qty 20, 7d supply, fill #0

## 2023-12-13 NOTE — Progress Notes (Signed)

## 2023-12-14 ENCOUNTER — Encounter: Payer: Self-pay | Admitting: Internal Medicine

## 2023-12-14 ENCOUNTER — Ambulatory Visit (HOSPITAL_BASED_OUTPATIENT_CLINIC_OR_DEPARTMENT_OTHER)
Admission: RE | Admit: 2023-12-14 | Discharge: 2023-12-14 | Disposition: A | Discharge status: Home / Self Care | Source: Ambulatory Visit | Attending: Internal Medicine

## 2023-12-14 ENCOUNTER — Ambulatory Visit
Admission: EM | Admit: 2023-12-14 | Discharge: 2023-12-14 | Disposition: A | Discharge status: Home / Self Care | Attending: Internal Medicine | Admitting: Internal Medicine

## 2023-12-14 DIAGNOSIS — R0602 Shortness of breath: Secondary | ICD-10-CM | POA: Diagnosis not present

## 2023-12-14 DIAGNOSIS — J209 Acute bronchitis, unspecified: Secondary | ICD-10-CM | POA: Diagnosis not present

## 2023-12-14 DIAGNOSIS — R059 Cough, unspecified: Secondary | ICD-10-CM | POA: Insufficient documentation

## 2023-12-14 DIAGNOSIS — H6123 Impacted cerumen, bilateral: Secondary | ICD-10-CM

## 2023-12-14 DIAGNOSIS — I10 Essential (primary) hypertension: Secondary | ICD-10-CM

## 2023-12-14 MED ORDER — ALBUTEROL SULFATE HFA 108 (90 BASE) MCG/ACT IN AERS: 1.0000 | INHALATION_SPRAY | Freq: Four times a day (QID) | RESPIRATORY_TRACT | 0 refills | Status: DC | PRN

## 2023-12-14 MED ORDER — PREDNISONE 20 MG PO TABS
20.0000 mg | ORAL_TABLET | Freq: Once | ORAL | Status: AC
  Administered 2023-12-14: 20 mg via ORAL

## 2023-12-14 MED ORDER — PROMETHAZINE-DM 6.25-15 MG/5ML PO SYRP: 5.0000 mL | ORAL_SOLUTION | Freq: Every evening | ORAL | 0 refills | Status: DC | PRN

## 2023-12-14 MED ORDER — AZITHROMYCIN 250 MG PO TABS: 250.0000 mg | ORAL_TABLET | Freq: Every day | ORAL | 0 refills | Status: DC

## 2023-12-14 NOTE — ED Triage Notes (Signed)
 Pt c/o cough for 9 days.

## 2023-12-14 NOTE — Discharge Instructions (Addendum)
 Please go to med [facility] and have x-ray performed. Do not check in to the ER. Go to imaging department, get images performed, then go home. You will receive a phone call if the x-ray shows any abnormal results requiring further treatment. If the x-ray results do not change our treatment plan, you will not receive a phone call.  Also see these results on MyChart.  Med [facility] [address] [place], [place]  You have bronchitis which is inflammation of the upper airways in your lungs.   Take azithromycin antibiotic as prescribed to treat atypical bacteria.  I'll call you if we need to add on another antibiotic based on chest x-ray results.   We will treat this with steroids. Do not take any NSAIDs with steroid pills (no ibuprofen, naproxen while taking steroid, this could cause stomach upset).   Use albuterol every 4-6 hours as needed for cough, shortness of breath, and wheezing.   Use guaifenesin (plain mucinex) to break up congestion in nose/chest so that you are able to excrete easier. Drink plenty of fluids to stay well hydrated while taking mucinex so that it works well in the body.   If you develop any new or worsening symptoms or if your symptoms do not start to improve, please return here or follow-up with your primary care provider. If your symptoms are severe, please go to the emergency room.

## 2023-12-14 NOTE — ED Provider Notes (Signed)
 [facility]    CSN: [id number] Arrival date & time: 12/14/23  1349      History   Chief Complaint Chief Complaint  Patient presents with   Cough    HPI Jeremy Blanchard is a 33 y.o. male.   Jeremy Blanchard is a 33 y.o. male presenting for chief complaint of cough, congestion, generalized fatigue, fever/chills that started 9 days ago. 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 No sick contacts at home. Cough is harsh and wet. Reports intermittent shortness of breath with rest and exertion.  He has had persistent low-grade fevers, most recently 99.5 yesterday.  Denies nausea, vomiting, diarrhea, abdominal pain, rash, and dizziness.  Denies recent antibiotic or steroid use in the last 90 days.  He has taken multiple at home COVID and flu tests all of which have been negative.  No history of asthma/COPD, never smoker/vape user.  He had an albuterol inhaler leftover from previous bronchitis illness in October 2024 and has been using this for shortness of breath with some relief.  He did an e-visit yesterday where he was prescribed Tessalon Perles and Flonase.  These medications are not helping very much with symptoms.  Prior to yesterday, patient had been using guaifenesin, Sudafed, Nasacort, Tylenol, Motrin, and other OTC medications without much relief of cough.   Cough   Past Medical History:  Diagnosis Date   Allergy    Phreesia 07/09/2020   Hyperlipidemia    Phreesia 07/09/2020   Seasonal allergies     Patient Active Problem List   Diagnosis Date Noted   Essential hypertension 04/17/2023    Past Surgical History:  Procedure Laterality Date   BRAIN SURGERY         Home Medications    Prior to Admission medications   Medication Sig Start Date End Date Taking? Authorizing Provider  albuterol (VENTOLIN HFA) 108 (90 Base) MCG/ACT inhaler Inhale 1-2 puffs into the lungs every 6 (six) hours as needed for wheezing or shortness of breath.  12/14/23  Yes Carlisle Beers, FNP  azithromycin (ZITHROMAX) 250 MG tablet Take 1 tablet (250 mg total) by mouth daily. Take first 2 tablets together, then 1 every day until finished. 12/14/23  Yes Carlisle Beers, FNP  promethazine-dextromethorphan (PROMETHAZINE-DM) 6.25-15 MG/5ML syrup Take 5 mLs by mouth at bedtime as needed for cough. 12/14/23  Yes Carlisle Beers, FNP  benzonatate (TESSALON) 100 MG capsule Take 1-2 capsules 3 times a day as needed Patient not taking: Reported on 12/14/2023 12/13/23   Mayers, Cari S, PA-C  cetirizine (ZYRTEC) 10 MG tablet Take 10 mg by mouth daily.    [provider]  fluticasone (FLONASE) 50 MCG/ACT nasal spray Place 2 sprays into both nostrils daily. 12/13/23   Mayers, Cari S, PA-C  losartan (COZAAR) 100 MG tablet Take 1 tablet (100 mg total) by mouth daily. 10/09/23   Georgina Quint, MD  naproxen sodium (ALEVE) 220 MG tablet Take 220 mg by mouth as needed.    [provider]    Family History Family History  Problem Relation Age of Onset   Hypertension Mother    High Cholesterol Father    Hypertension Father    Hypertension Brother    Healthy Brother     Social History Social History   Tobacco Use   Smoking status: Never   Smokeless tobacco: Never  Vaping Use   Vaping status: Never Used  Substance Use Topics  Alcohol use: Yes    Comment: socal drinker   Drug use: Never     Allergies   Patient has no known allergies.   Review of Systems Review of Systems  Respiratory:  Positive for cough.   Per HPI  Physical Exam Triage Vital Signs ED Triage Vitals  Encounter Vitals Group     BP 12/14/23 1352 (!) 147/89     Systolic BP Percentile --      Diastolic BP Percentile --      Pulse Rate 12/14/23 1352 87     Resp 12/14/23 1352 17     Temp 12/14/23 1352 98 F (36.7 C)     Temp Source 12/14/23 1352 Oral     SpO2 12/14/23 1352 97 %     Weight --      Height --      Head Circumference --       Peak Flow --      Pain Score 12/14/23 1355 0     Pain Loc --      Pain Education --      Exclude from Growth Chart --    No data found.  Updated Vital Signs BP (!) 147/89 (BP Location: Right Arm)   Pulse 87   Temp 98 F (36.7 C) (Oral)   Resp 17   SpO2 97%   Visual Acuity Right Eye Distance:   Left Eye Distance:   Bilateral Distance:    Right Eye Near:   Left Eye Near:    Bilateral Near:     Physical Exam Vitals and nursing note reviewed.  Constitutional:      Appearance: He is not ill-appearing or toxic-appearing.  HENT:     Head: Normocephalic and atraumatic.     Right Ear: Hearing, tympanic membrane, ear canal and external ear normal.     Left Ear: Hearing, tympanic membrane, ear canal and external ear normal.     Nose: Nose normal.     Mouth/Throat:     Lips: Pink.     Mouth: Mucous membranes are moist. No injury or oral lesions.     Dentition: Normal dentition.     Tongue: No lesions.     Pharynx: Oropharynx is clear. Uvula midline. Posterior oropharyngeal erythema present. No pharyngeal swelling, oropharyngeal exudate, uvula swelling or postnasal drip.     Tonsils: No tonsillar exudate.     Comments: Mild erythema to the bilateral tonsillar pillars likely secondary to coughing. Eyes:     General: Lids are normal. Vision grossly intact. Gaze aligned appropriately.     Extraocular Movements: Extraocular movements intact.     Conjunctiva/sclera: Conjunctivae normal.  Neck:     Trachea: Trachea and phonation normal.  Cardiovascular:     Rate and Rhythm: Normal rate and regular rhythm.     Heart sounds: Normal heart sounds, S1 normal and S2 normal.  Pulmonary:     Effort: Pulmonary effort is normal. No respiratory distress.     Breath sounds: Normal breath sounds and air entry. No wheezing, rhonchi or rales.     Comments: Coarse breath sounds throughout.  No focal lung finding.  Speaking in full sentences without difficulty. Chest:     Chest wall: No  tenderness.  Musculoskeletal:     Cervical back: Neck supple.  Lymphadenopathy:     Cervical: No cervical adenopathy.  Skin:    General: Skin is warm and dry.     Capillary Refill: Capillary refill takes less than 2 seconds.  Findings: No rash.  Neurological:     General: No focal deficit present.     Mental Status: He is alert and oriented to person, place, and time. Mental status is at baseline.     Cranial Nerves: No dysarthria or facial asymmetry.  Psychiatric:        Mood and Affect: Mood normal.        Speech: Speech normal.        Behavior: Behavior normal.        Thought Content: Thought content normal.        Judgment: Judgment normal.      UC Treatments / Results  Labs (all labs ordered are listed, but only abnormal results are displayed) Labs Reviewed - No data to display  EKG   Radiology No results found.  Procedures Procedures (including critical care time)  Medications Ordered in UC Medications  predniSONE (DELTASONE) tablet 20 mg (20 mg Oral Given 12/14/23 1417)    Initial Impression / Assessment and Plan / UC Course  I have reviewed the triage vital signs and the nursing notes.  Pertinent labs & imaging results that were available during my care of the patient were reviewed by me and considered in my medical decision making (see chart for details).   1.  Acute bronchitis, shortness of breath Evaluation suggests viral bronchitis, however chest x-ray ordered given clinical concern for underlying focal consolidation/pneumonia. Staff will call if chest x-ray is abnormal or if results require change in current treatment plan.   Recommend treatment with steroid, bronchodilator, cough suppressants for symptomatic relief, and expectorants (mucinex) as needed- see AVS.   I also like to go ahead and treat with azithromycin to cover for any atypical bacteria to the chest given persistent low-grade fevers and wet cough.  2.  Bilateral impacted cerumen Both  ear(s) cleaned with ear lavage to remove ear wax impactions bilaterally by nursing staff.  Reassessment shows normal ear drums and canals without signs of infection.  Patient may use debrox ear drops at home as needed for wax removal and has been advised to avoid using Q-tips.   3.  Elevated blood pressure reading in office with diagnosis of hypertension Takes losartan daily without missed doses. No red flag signs or symptoms indicating need for referral to the ER related to elevated blood pressure. PCP follow-up recommended.  Counseled patient on potential for adverse effects with medications prescribed/recommended today, strict ER and return-to-clinic precautions discussed, patient verbalized understanding.    Final Clinical Impressions(s) / UC Diagnoses   Final diagnoses:  Acute bronchitis, unspecified organism  Shortness of breath  Bilateral impacted cerumen  Elevated blood pressure reading in office with diagnosis of hypertension     Discharge Instructions      Please go to med [facility] and have x-ray performed. Do not check in to the ER. Go to imaging department, get images performed, then go home. You will receive a phone call if the x-ray shows any abnormal results requiring further treatment. If the x-ray results do not change our treatment plan, you will not receive a phone call.  Also see these results on MyChart.  Med [facility] [address] [place], [place]  You have bronchitis which is inflammation of the upper airways in your lungs.   Take azithromycin antibiotic as prescribed to treat atypical bacteria.  I'll call you if we need to add on another antibiotic based on chest x-ray results.   We will treat this  with steroids. Do not take any NSAIDs with steroid pills (no ibuprofen, naproxen while taking steroid, this could cause stomach upset).   Use albuterol every 4-6 hours as needed for cough, shortness of breath, and wheezing.   Use  guaifenesin (plain mucinex) to break up congestion in nose/chest so that you are able to excrete easier. Drink plenty of fluids to stay well hydrated while taking mucinex so that it works well in the body.   If you develop any new or worsening symptoms or if your symptoms do not start to improve, please return here or follow-up with your primary care provider. If your symptoms are severe, please go to the emergency room.      ED Prescriptions     Medication Sig Dispense Auth. Provider   azithromycin (ZITHROMAX) 250 MG tablet Take 1 tablet (250 mg total) by mouth daily. Take first 2 tablets together, then 1 every day until finished. 6 tablet Carlisle Beers, FNP   promethazine-dextromethorphan (PROMETHAZINE-DM) 6.25-15 MG/5ML syrup Take 5 mLs by mouth at bedtime as needed for cough. 118 mL Reita May M, FNP   albuterol (VENTOLIN HFA) 108 (90 Base) MCG/ACT inhaler Inhale 1-2 puffs into the lungs every 6 (six) hours as needed for wheezing or shortness of breath. 18 g Carlisle Beers, FNP      PDMP not reviewed this encounter.   Carlisle Beers, [place] 12/14/23 1436

## 2023-12-16 ENCOUNTER — Telehealth (HOSPITAL_COMMUNITY): Payer: Self-pay

## 2023-12-16 MED ORDER — PREDNISONE 20 MG PO TABS: 40.0000 mg | ORAL_TABLET | Freq: Every day | ORAL | 0 refills | Status: AC

## 2023-12-16 NOTE — Telephone Encounter (Signed)
-----   Message from Carlisle Beers sent at 12/16/2023  9:29 AM EDT ----- Prednisone 40mg  (2 pills) once daily for 5 days as a burst.  Thanks, Sue Lush! ----- Message ----- From: Warren Danes, RN Sent: 12/15/2023   2:44 PM EDT To: Carlisle Beers, FNP  Pt sent the following message to you, Marcelino Duster. Please advise. Thanks!

## 2024-01-19 ENCOUNTER — Encounter: Payer: Self-pay | Admitting: Emergency Medicine

## 2024-01-19 NOTE — Telephone Encounter (Signed)
 Patient dropped off form and it was placed in Dr. Hilaria Loveless box up front.

## 2024-01-21 ENCOUNTER — Other Ambulatory Visit (HOSPITAL_BASED_OUTPATIENT_CLINIC_OR_DEPARTMENT_OTHER): Payer: Self-pay

## 2024-02-16 ENCOUNTER — Telehealth: Payer: Self-pay | Admitting: Emergency Medicine

## 2024-02-16 NOTE — Telephone Encounter (Signed)
 Copied from CRM [id number]. Topic: Clinical - Request for Lab/Test Order >> Feb 16, 2024  1:37 PM Jeremy Blanchard wrote: Reason for CRM: Patient called in stating that he have to get tb quantiferon gold test done that he has done every 2 years for his job.He would like to know if order could be sent to [facility] on [address] in [place]?

## 2024-02-17 ENCOUNTER — Encounter: Payer: Self-pay | Admitting: Emergency Medicine

## 2024-02-17 NOTE — Telephone Encounter (Signed)
 Being addressed in most recent MyChart encounter (02/17/24)

## 2024-02-17 NOTE — Telephone Encounter (Signed)
 Yes

## 2024-02-18 ENCOUNTER — Other Ambulatory Visit: Payer: Self-pay

## 2024-02-18 DIAGNOSIS — Z111 Encounter for screening for respiratory tuberculosis: Secondary | ICD-10-CM

## 2024-02-20 ENCOUNTER — Other Ambulatory Visit: Payer: Self-pay | Admitting: Emergency Medicine

## 2024-02-20 DIAGNOSIS — Z111 Encounter for screening for respiratory tuberculosis: Secondary | ICD-10-CM | POA: Diagnosis not present

## 2024-02-20 NOTE — Telephone Encounter (Signed)
 Copied from CRM [id number]. Topic: Clinical - Request for Lab/Test Order >> Feb 20, 2024 10:41 AM Jeremy Blanchard wrote: Reason for CRM: Patient called in requesting that his lab order for QuantiFERON-TB Gold Plus (Order [id number]) get faxed over to [facility] now being that he is there. Patient states that he was told prior that it would be sent to them electronically and the lab confirmed they did not receive it. The [facility] fax number is [phone], patient is requesting it gets faxed over now.

## 2024-02-24 LAB — QUANTIFERON-TB GOLD PLUS
QuantiFERON Mitogen Value: >10 [IU]/mL
QuantiFERON Nil Value: 0.05 [IU]/mL
QuantiFERON TB1 Ag Value: 0.05 [IU]/mL
QuantiFERON TB2 Ag Value: 0.06 [IU]/mL
QuantiFERON-TB Gold Plus: NEGATIVE

## 2024-03-11 ENCOUNTER — Other Ambulatory Visit (HOSPITAL_BASED_OUTPATIENT_CLINIC_OR_DEPARTMENT_OTHER): Payer: Self-pay

## 2024-03-17 ENCOUNTER — Other Ambulatory Visit (HOSPITAL_BASED_OUTPATIENT_CLINIC_OR_DEPARTMENT_OTHER): Payer: Self-pay

## 2024-03-22 ENCOUNTER — Other Ambulatory Visit (HOSPITAL_BASED_OUTPATIENT_CLINIC_OR_DEPARTMENT_OTHER): Payer: Self-pay

## 2024-03-23 ENCOUNTER — Other Ambulatory Visit (HOSPITAL_BASED_OUTPATIENT_CLINIC_OR_DEPARTMENT_OTHER): Payer: Self-pay

## 2024-04-23 ENCOUNTER — Other Ambulatory Visit (HOSPITAL_BASED_OUTPATIENT_CLINIC_OR_DEPARTMENT_OTHER): Payer: Self-pay

## 2024-05-04 ENCOUNTER — Encounter: Payer: Self-pay | Admitting: Emergency Medicine

## 2024-05-04 ENCOUNTER — Ambulatory Visit: Admitting: Emergency Medicine

## 2024-05-04 ENCOUNTER — Ambulatory Visit: Payer: Self-pay | Admitting: Emergency Medicine

## 2024-05-04 VITALS — BP 126/72 | HR 87 | Temp 98.5°F | Ht 69.0 in | Wt 206.0 lb

## 2024-05-04 DIAGNOSIS — Z0000 Encounter for general adult medical examination without abnormal findings: Secondary | ICD-10-CM

## 2024-05-04 DIAGNOSIS — I10 Essential (primary) hypertension: Secondary | ICD-10-CM

## 2024-05-04 DIAGNOSIS — Z13228 Encounter for screening for other metabolic disorders: Secondary | ICD-10-CM

## 2024-05-04 DIAGNOSIS — E291 Testicular hypofunction: Secondary | ICD-10-CM

## 2024-05-04 DIAGNOSIS — Z13220 Encounter for screening for lipoid disorders: Secondary | ICD-10-CM

## 2024-05-04 DIAGNOSIS — Z130 Encounter for screening for diseases of the blood and blood-forming organs and certain disorders involving the immune mechanism: Secondary | ICD-10-CM

## 2024-05-04 DIAGNOSIS — Z1329 Encounter for screening for other suspected endocrine disorder: Secondary | ICD-10-CM

## 2024-05-04 LAB — CBC WITH DIFFERENTIAL/PLATELET
Basophils Absolute: 0 K/uL (ref 0.0–0.1)
Basophils Relative: 0.5 % (ref 0.0–3.0)
Eosinophils Absolute: 0.1 K/uL (ref 0.0–0.7)
Eosinophils Relative: 1.5 % (ref 0.0–5.0)
HCT: 43.5 % (ref 39.0–52.0)
Hemoglobin: 14.9 g/dL (ref 13.0–17.0)
Lymphocytes Relative: 32.8 % (ref 12.0–46.0)
Lymphs Abs: 1.9 K/uL (ref 0.7–4.0)
MCHC: 34.2 g/dL (ref 30.0–36.0)
MCV: 84.8 fl (ref 78.0–100.0)
Monocytes Absolute: 0.5 K/uL (ref 0.1–1.0)
Monocytes Relative: 7.9 % (ref 3.0–12.0)
Neutro Abs: 3.3 K/uL (ref 1.4–7.7)
Neutrophils Relative %: 57.3 % (ref 43.0–77.0)
Platelets: 246 K/uL (ref 150.0–400.0)
RBC: 5.13 Mil/uL (ref 4.22–5.81)
RDW: 13.2 % (ref 11.5–15.5)
WBC: 5.8 K/uL (ref 4.0–10.5)

## 2024-05-04 LAB — LIPID PANEL
Cholesterol: 184 mg/dL (ref 0–200)
HDL: 31.9 mg/dL — ABNORMAL LOW (ref >39.00)
LDL Cholesterol: 94 mg/dL (ref 0–99)
NonHDL: 151.73
Total CHOL/HDL Ratio: 6
Triglycerides: 291 mg/dL — ABNORMAL HIGH (ref 0.0–149.0)
VLDL: 58.2 mg/dL — ABNORMAL HIGH (ref 0.0–40.0)

## 2024-05-04 LAB — COMPREHENSIVE METABOLIC PANEL WITH GFR
ALT: 33 U/L (ref 0–53)
AST: 27 U/L (ref 0–37)
Albumin: 4.3 g/dL (ref 3.5–5.2)
Alkaline Phosphatase: 45 U/L (ref 39–117)
BUN: 20 mg/dL (ref 6–23)
CO2: 30 meq/L (ref 19–32)
Calcium: 9.2 mg/dL (ref 8.4–10.5)
Chloride: 100 meq/L (ref 96–112)
Creatinine, Ser: 0.94 mg/dL (ref 0.40–1.50)
GFR: 107 mL/min (ref >60.00)
Glucose, Bld: 103 mg/dL — ABNORMAL HIGH (ref 70–99)
Potassium: 4.3 meq/L (ref 3.5–5.1)
Sodium: 136 meq/L (ref 135–145)
Total Bilirubin: 0.6 mg/dL (ref 0.2–1.2)
Total Protein: 7.4 g/dL (ref 6.0–8.3)

## 2024-05-04 LAB — VITAMIN D 25 HYDROXY (VIT D DEFICIENCY, FRACTURES): VITD: 32.29 ng/mL (ref 30.00–100.00)

## 2024-05-04 LAB — TESTOSTERONE: Testosterone: 270.81 ng/dL — ABNORMAL LOW (ref 300.00–890.00)

## 2024-05-04 LAB — VITAMIN B12: Vitamin B-12: 296 pg/mL (ref 211–911)

## 2024-05-04 LAB — HEMOGLOBIN A1C: Hgb A1c MFr Bld: 6 % (ref 4.6–6.5)

## 2024-05-04 NOTE — Progress Notes (Signed)
 Jeremy Blanchard 33 y.o.   Chief Complaint  Patient presents with   Annual Exam    Patient here for physical. No other concerns     HISTORY OF PRESENT ILLNESS: This is a 33 y.o. male here for annual exam History of hypertension on losartan  Has no complaints or medical concerns today.  HPI   Prior to Admission medications   Medication Sig Start Date End Date Taking? Authorizing Provider  losartan  (COZAAR ) 100 MG tablet Take 1 tablet (100 mg total) by mouth daily. 10/09/23  Yes Jonai Weyland, Emil Schanz, MD  naproxen sodium (ALEVE) 220 MG tablet Take 220 mg by mouth as needed.   Yes [provider]  azithromycin  (ZITHROMAX ) 250 MG tablet Take 1 tablet (250 mg total) by mouth daily. Take first 2 tablets together, then 1 every day until finished. 12/14/23   Enedelia Dorna HERO, FNP  benzonatate  (TESSALON ) 100 MG capsule Take 1-2 capsules 3 times a day as needed Patient not taking: Reported on 12/14/2023 12/13/23   Mayers, Cari S, PA-C  cetirizine (ZYRTEC) 10 MG tablet Take 10 mg by mouth daily.    [provider]  fluticasone  (FLONASE ) 50 MCG/ACT nasal spray Place 2 sprays into both nostrils daily. 12/13/23   Mayers, Cari S, PA-C  promethazine -dextromethorphan (PROMETHAZINE -DM) 6.25-15 MG/5ML syrup Take 5 mLs by mouth at bedtime as needed for cough. 12/14/23   Enedelia Dorna HERO, FNP    No Known Allergies  Patient Active Problem List   Diagnosis Date Noted   Essential hypertension 04/17/2023    Past Medical History:  Diagnosis Date   Allergy    [employer]reesia 07/09/2020   Hyperlipidemia    [employer]reesia 07/09/2020   Seasonal allergies     Past Surgical History:  Procedure Laterality Date   BRAIN SURGERY      Social History   Socioeconomic History   Marital status: Married    Spouse name: Not on file   Number of children: Not on file   Years of education: Not on file   Highest education level: Bachelor's degree (e.g., BA, AB, BS)  Occupational History    Occupation: EMT  Tobacco Use   Smoking status: Never   Smokeless tobacco: Never  Vaping Use   Vaping status: Never Used  Substance and Sexual Activity   Alcohol use: Yes    Comment: socal drinker   Drug use: Never   Sexual activity: Yes  Other Topics Concern   Not on file  Social History Narrative   Not on file   Social Drivers of Health   Financial Resource Strain: Low Risk  (04/30/2024)   Overall Financial Resource Strain (CARDIA)    Difficulty of Paying Living Expenses: Not hard at all  Food Insecurity: No Food Insecurity (04/30/2024)   Hunger Vital Sign    Worried About Running Out of Food in the Last Year: Never true    Ran Out of Food in the Last Year: Never true  Transportation Needs: No Transportation Needs (04/30/2024)   PRAPARE - Administrator, Civil Service (Medical): No    Lack of Transportation (Non-Medical): No  Physical Activity: Sufficiently Active (04/30/2024)   Exercise Vital Sign    Days of Exercise per Week: 6 days    Minutes of Exercise per Session: 60 min  Stress: No Stress Concern Present (04/30/2024)   [employer] of Occupational Health - Occupational Stress Questionnaire    Feeling of Stress: Not at all  Social Connections: Moderately Integrated (04/30/2024)  Social Advertising account executive    Frequency of Communication with Friends and Family: More than three times a week    Frequency of Social Gatherings with Friends and Family: Once a week    Attends Religious Services: Never    Database administrator or Organizations: Yes    Attends Engineer, structural: More than 4 times per year    Marital Status: Married  Catering manager Violence: Not on file    Family History  Problem Relation Age of Onset   Hypertension Mother    High Cholesterol Father    Hypertension Father    Hypertension Brother    Healthy Brother      Review of Systems  Constitutional: Negative.  Negative for chills and fever.  HENT:  Negative.  Negative for congestion and sore throat.   Respiratory: Negative.  Negative for cough and shortness of breath.   Cardiovascular: Negative.  Negative for chest pain and palpitations.  Gastrointestinal:  Negative for abdominal pain, diarrhea, nausea and vomiting.  Genitourinary: Negative.  Negative for dysuria and hematuria.  Skin: Negative.  Negative for rash.  Neurological: Negative.  Negative for dizziness and headaches.  All other systems reviewed and are negative.   Today's Vitals   05/04/24 1346  BP: 126/72  Pulse: 87  Temp: 98.5 F (36.9 C)  TempSrc: Oral  SpO2: 97%  Weight: 206 lb (93.4 kg)  Height: 5' 9 (1.753 m)   Body mass index is 30.42 kg/m.   Physical Exam Vitals reviewed.  Constitutional:      Appearance: Normal appearance.  HENT:     Head: Normocephalic.     Right Ear: Tympanic membrane, ear canal and external ear normal.     Left Ear: Tympanic membrane, ear canal and external ear normal.     Mouth/Throat:     Mouth: Mucous membranes are moist.     Pharynx: Oropharynx is clear.  Eyes:     Extraocular Movements: Extraocular movements intact.     Conjunctiva/sclera: Conjunctivae normal.     Pupils: Pupils are equal, round, and reactive to light.  Cardiovascular:     Rate and Rhythm: Normal rate and regular rhythm.     Pulses: Normal pulses.     Heart sounds: Normal heart sounds.  Pulmonary:     Effort: Pulmonary effort is normal.     Breath sounds: Normal breath sounds.  Abdominal:     Palpations: Abdomen is soft.     Tenderness: There is no abdominal tenderness.  Musculoskeletal:     Cervical back: No tenderness.  Lymphadenopathy:     Cervical: No cervical adenopathy.  Skin:    General: Skin is warm and dry.  Neurological:     Mental Status: He is alert and oriented to person, place, and time.  Psychiatric:        Mood and Affect: Mood normal.        Behavior: Behavior normal.      ASSESSMENT & PLAN: Problem List Items  Addressed This Visit       Cardiovascular and Mediastinum   Essential hypertension   BP Readings from Last 3 Encounters:  05/04/24 126/72  12/14/23 (!) 147/89  07/12/23 (!) 143/83  Well-controlled hypertension Continue losartan  100 mg daily       Relevant Orders   Comprehensive metabolic panel with GFR   Other Visit Diagnoses       Routine general medical examination at a health care facility    -  Primary  Relevant Orders   Testosterone    CBC with Differential/Platelet   Comprehensive metabolic panel with GFR   Vitamin B12   VITAMIN D  25 Hydroxy (Vit-D Deficiency, Fractures)   Hemoglobin A1c   Lipid panel     Screening for deficiency anemia       Relevant Orders   CBC with Differential/Platelet     Screening for lipoid disorders       Relevant Orders   Lipid panel     Screening for endocrine, metabolic and immunity disorder       Relevant Orders   Testosterone    Comprehensive metabolic panel with GFR   Vitamin B12   VITAMIN D  25 Hydroxy (Vit-D Deficiency, Fractures)   Hemoglobin A1c      Modifiable risk factors discussed with patient. Anticipatory guidance according to age provided. The following topics were also discussed: Social Determinants of Health Smoking.  Non-smoker Diet and nutrition Benefits of exercise Cancer family history review Vaccinations review and recommendations Cardiovascular risk assessment and need for blood work Mental health including depression and anxiety Fall and accident prevention  Patient Instructions  Health Maintenance, Male Adopting a healthy lifestyle and getting preventive care are important in promoting health and wellness. Ask your health care provider about: The right schedule for you to have regular tests and exams. Things you can do on your own to prevent diseases and keep yourself healthy. What should I know about diet, weight, and exercise? Eat a healthy diet  Eat a diet that includes plenty of vegetables,  fruits, low-fat dairy products, and lean protein. Do not eat a lot of foods that are high in solid fats, added sugars, or sodium. Maintain a healthy weight Body mass index (BMI) is a measurement that can be used to identify possible weight problems. It estimates body fat based on height and weight. Your health care provider can help determine your BMI and help you achieve or maintain a healthy weight. Get regular exercise Get regular exercise. This is one of the most important things you can do for your health. Most adults should: Exercise for at least 150 minutes each week. The exercise should increase your heart rate and make you sweat (moderate-intensity exercise). Do strengthening exercises at least twice a week. This is in addition to the moderate-intensity exercise. Spend less time sitting. Even light physical activity can be beneficial. Watch cholesterol and blood lipids Have your blood tested for lipids and cholesterol at 33 years of age, then have this test every 5 years. You may need to have your cholesterol levels checked more often if: Your lipid or cholesterol levels are high. You are older than 33 years of age. You are at high risk for heart disease. What should I know about cancer screening? Many types of cancers can be detected early and may often be prevented. Depending on your health history and family history, you may need to have cancer screening at various ages. This may include screening for: Colorectal cancer. Prostate cancer. Skin cancer. Lung cancer. What should I know about heart disease, diabetes, and high blood pressure? Blood pressure and heart disease High blood pressure causes heart disease and increases the risk of stroke. This is more likely to develop in people who have high blood pressure readings or are overweight. Talk with your health care provider about your target blood pressure readings. Have your blood pressure checked: Every 3-5 years if you are  57-28 years of age. Every year if you are 78 years old or older.  If you are between the ages of 44 and 71 and are a current or former smoker, ask your health care provider if you should have a one-time screening for abdominal aortic aneurysm (AAA). Diabetes Have regular diabetes screenings. This checks your fasting blood sugar level. Have the screening done: Once every three years after age 73 if you are at a normal weight and have a low risk for diabetes. More often and at a younger age if you are overweight or have a high risk for diabetes. What should I know about preventing infection? Hepatitis B If you have a higher risk for hepatitis B, you should be screened for this virus. Talk with your health care provider to find out if you are at risk for hepatitis B infection. Hepatitis C Blood testing is recommended for: Everyone born from [birth year] through 1965. Anyone with known risk factors for hepatitis C. Sexually transmitted infections (STIs) You should be screened each year for STIs, including gonorrhea and chlamydia, if: You are sexually active and are younger than 33 years of age. You are older than 33 years of age and your health care provider tells you that you are at risk for this type of infection. Your sexual activity has changed since you were last screened, and you are at increased risk for chlamydia or gonorrhea. Ask your health care provider if you are at risk. Ask your health care provider about whether you are at high risk for HIV. Your health care provider may recommend a prescription medicine to help prevent HIV infection. If you choose to take medicine to prevent HIV, you should first get tested for HIV. You should then be tested every 3 months for as long as you are taking the medicine. Follow these instructions at home: Alcohol use Do not drink alcohol if your health care provider tells you not to drink. If you drink alcohol: Limit how much you have to 0-2 drinks a day. Know  how much alcohol is in your drink. In the [place] one drink equals one 12 oz bottle of beer (355 mL), one 5 oz glass of wine (148 mL), or one 1 oz glass of hard liquor (44 mL). Lifestyle Do not use any products that contain nicotine or tobacco. These products include cigarettes, chewing tobacco, and vaping devices, such as e-cigarettes. If you need help quitting, ask your health care provider. Do not use street drugs. Do not share needles. Ask your health care provider for help if you need support or information about quitting drugs. General instructions Schedule regular health, dental, and eye exams. Stay current with your vaccines. Tell your health care provider if: You often feel depressed. You have ever been abused or do not feel safe at home. Summary Adopting a healthy lifestyle and getting preventive care are important in promoting health and wellness. Follow your health care provider's instructions about healthy diet, exercising, and getting tested or screened for diseases. Follow your health care provider's instructions on monitoring your cholesterol and blood pressure. This information is not intended to replace advice given to you by your health care provider. Make sure you discuss any questions you have with your health care provider. Document Revised: 01/22/2021 Document Reviewed: 01/22/2021 Elsevier Patient Education  2024 Elsevier Inc.     Emil Schaumann, MD [facility]

## 2024-05-04 NOTE — Assessment & Plan Note (Signed)
 BP Readings from Last 3 Encounters:  05/04/24 126/72  12/14/23 (!) 147/89  07/12/23 (!) 143/83  Well-controlled hypertension Continue losartan  100 mg daily

## 2024-05-04 NOTE — Patient Instructions (Signed)
 Health Maintenance, Male  Adopting a healthy lifestyle and getting preventive care are important in promoting health and wellness. Ask your health care provider about:  The right schedule for you to have regular tests and exams.  Things you can do on your own to prevent diseases and keep yourself healthy.  What should I know about diet, weight, and exercise?  Eat a healthy diet    Eat a diet that includes plenty of vegetables, fruits, low-fat dairy products, and lean protein.  Do not eat a lot of foods that are high in solid fats, added sugars, or sodium.  Maintain a healthy weight  Body mass index (BMI) is a measurement that can be used to identify possible weight problems. It estimates body fat based on height and weight. Your health care provider can help determine your BMI and help you achieve or maintain a healthy weight.  Get regular exercise  Get regular exercise. This is one of the most important things you can do for your health. Most adults should:  Exercise for at least 150 minutes each week. The exercise should increase your heart rate and make you sweat (moderate-intensity exercise).  Do strengthening exercises at least twice a week. This is in addition to the moderate-intensity exercise.  Spend less time sitting. Even light physical activity can be beneficial.  Watch cholesterol and blood lipids  Have your blood tested for lipids and cholesterol at 33 years of age, then have this test every 5 years.  You may need to have your cholesterol levels checked more often if:  Your lipid or cholesterol levels are high.  You are older than 33 years of age.  You are at high risk for heart disease.  What should I know about cancer screening?  Many types of cancers can be detected early and may often be prevented. Depending on your health history and family history, you may need to have cancer screening at various ages. This may include screening for:  Colorectal cancer.  Prostate cancer.  Skin cancer.  Lung  cancer.  What should I know about heart disease, diabetes, and high blood pressure?  Blood pressure and heart disease  High blood pressure causes heart disease and increases the risk of stroke. This is more likely to develop in people who have high blood pressure readings or are overweight.  Talk with your health care provider about your target blood pressure readings.  Have your blood pressure checked:  Every 3-5 years if you are 33-33 years of age.  Every year if you are 3 years old or older.  If you are between the ages of 60 and 72 and are a current or former smoker, ask your health care provider if you should have a one-time screening for abdominal aortic aneurysm (AAA).  Diabetes  Have regular diabetes screenings. This checks your fasting blood sugar level. Have the screening done:  Once every three years after age 33 if you are at a normal weight and have a low risk for diabetes.  More often and at a younger age if you are overweight or have a high risk for diabetes.  What should I know about preventing infection?  Hepatitis B  If you have a higher risk for hepatitis B, you should be screened for this virus. Talk with your health care provider to find out if you are at risk for hepatitis B infection.  Hepatitis C  Blood testing is recommended for:  Everyone born from [birth year] through 1965.  Anyone  with known risk factors for hepatitis C.  Sexually transmitted infections (STIs)  You should be screened each year for STIs, including gonorrhea and chlamydia, if:  You are sexually active and are younger than 33 years of age.  You are older than 33 years of age and your health care provider tells you that you are at risk for this type of infection.  Your sexual activity has changed since you were last screened, and you are at increased risk for chlamydia or gonorrhea. Ask your health care provider if you are at risk.  Ask your health care provider about whether you are at high risk for HIV. Your health care provider  may recommend a prescription medicine to help prevent HIV infection. If you choose to take medicine to prevent HIV, you should first get tested for HIV. You should then be tested every 3 months for as long as you are taking the medicine.  Follow these instructions at home:  Alcohol use  Do not drink alcohol if your health care provider tells you not to drink.  If you drink alcohol:  Limit how much you have to 0-2 drinks a day.  Know how much alcohol is in your drink. In the [place] one drink equals one 12 oz bottle of beer (355 mL), one 5 oz glass of wine (148 mL), or one 1 oz glass of hard liquor (44 mL).  Lifestyle  Do not use any products that contain nicotine or tobacco. These products include cigarettes, chewing tobacco, and vaping devices, such as e-cigarettes. If you need help quitting, ask your health care provider.  Do not use street drugs.  Do not share needles.  Ask your health care provider for help if you need support or information about quitting drugs.  General instructions  Schedule regular health, dental, and eye exams.  Stay current with your vaccines.  Tell your health care provider if:  You often feel depressed.  You have ever been abused or do not feel safe at home.  Summary  Adopting a healthy lifestyle and getting preventive care are important in promoting health and wellness.  Follow your health care provider's instructions about healthy diet, exercising, and getting tested or screened for diseases.  Follow your health care provider's instructions on monitoring your cholesterol and blood pressure.  This information is not intended to replace advice given to you by your health care provider. Make sure you discuss any questions you have with your health care provider.  Document Revised: 01/22/2021 Document Reviewed: 01/22/2021  Elsevier Patient Education  2024 [employer].

## 2024-06-08 ENCOUNTER — Other Ambulatory Visit (HOSPITAL_BASED_OUTPATIENT_CLINIC_OR_DEPARTMENT_OTHER): Payer: Self-pay

## 2024-06-08 MED ORDER — FLUZONE 0.5 ML IM SUSY
0.5000 mL | PREFILLED_SYRINGE | Freq: Once | INTRAMUSCULAR | 0 refills | Status: AC
Start: 2024-06-08 — End: 2024-06-09
  Filled 2024-06-08: qty 0.5, 1d supply, fill #0

## 2024-06-28 ENCOUNTER — Other Ambulatory Visit (HOSPITAL_BASED_OUTPATIENT_CLINIC_OR_DEPARTMENT_OTHER): Payer: Self-pay

## 2024-08-02 ENCOUNTER — Other Ambulatory Visit: Payer: Self-pay

## 2024-09-17 ENCOUNTER — Other Ambulatory Visit (HOSPITAL_COMMUNITY): Payer: Self-pay

## 2024-09-17 ENCOUNTER — Other Ambulatory Visit: Payer: Self-pay | Admitting: Emergency Medicine

## 2024-09-17 ENCOUNTER — Other Ambulatory Visit (HOSPITAL_BASED_OUTPATIENT_CLINIC_OR_DEPARTMENT_OTHER): Payer: Self-pay

## 2024-09-17 ENCOUNTER — Telehealth: Payer: Self-pay

## 2024-09-17 MED ORDER — LOSARTAN POTASSIUM 100 MG PO TABS
100.0000 mg | ORAL_TABLET | Freq: Every day | ORAL | 3 refills | Status: AC
  Filled 2024-09-17 (×2): qty 90, 90d supply, fill #0

## 2024-09-17 NOTE — Telephone Encounter (Signed)
 Copied from CRM [id number]. Topic: Clinical - Medication Question >> Sep 17, 2024 11:01 AM Deaijah H wrote: Reason for CRM: Patient would like medication sent to [facility] [address] [place], [place] [address]

## 2024-09-17 NOTE — Telephone Encounter (Signed)
"  This has been updated    "

## 2024-09-23 ENCOUNTER — Other Ambulatory Visit (HOSPITAL_BASED_OUTPATIENT_CLINIC_OR_DEPARTMENT_OTHER): Payer: Self-pay

## 2024-09-23 ENCOUNTER — Other Ambulatory Visit (HOSPITAL_COMMUNITY): Payer: Self-pay

## 2024-11-10 ENCOUNTER — Ambulatory Visit: Admitting: Endocrinology
